# Patient Record
Sex: Male | Born: 1980 | Hispanic: No | Marital: Married | Smoking: Former smoker
Health system: Southern US, Community
[De-identification: ages and names within clinical notes are randomized; demographics above are authoritative.]

## PROBLEM LIST (undated history)

## (undated) DIAGNOSIS — R12 Heartburn: Secondary | ICD-10-CM

## (undated) DIAGNOSIS — Q249 Congenital malformation of heart, unspecified: Secondary | ICD-10-CM

## (undated) HISTORY — DX: Congenital malformation of heart, unspecified: Q24.9

## (undated) HISTORY — DX: Heartburn: R12

---

## 2021-01-22 DIAGNOSIS — Z0289 Encounter for other administrative examinations: Secondary | ICD-10-CM | POA: Insufficient documentation

## 2021-01-23 ENCOUNTER — Other Ambulatory Visit: Payer: Self-pay

## 2021-01-23 ENCOUNTER — Ambulatory Visit (INDEPENDENT_AMBULATORY_CARE_PROVIDER_SITE_OTHER): Payer: Self-pay | Admitting: Family Medicine

## 2021-01-23 ENCOUNTER — Other Ambulatory Visit (HOSPITAL_COMMUNITY)
Admission: RE | Admit: 2021-01-23 | Discharge: 2021-01-23 | Disposition: A | Discharge status: Home / Self Care | Payer: Medicaid Other | Source: Ambulatory Visit | Attending: Family Medicine | Admitting: Family Medicine

## 2021-01-23 VITALS — BP 110/84 | HR 54 | Ht 70.28 in | Wt 185.2 lb

## 2021-01-23 DIAGNOSIS — Z1159 Encounter for screening for other viral diseases: Secondary | ICD-10-CM

## 2021-01-23 DIAGNOSIS — R12 Heartburn: Secondary | ICD-10-CM

## 2021-01-23 DIAGNOSIS — Z114 Encounter for screening for human immunodeficiency virus [HIV]: Secondary | ICD-10-CM

## 2021-01-23 DIAGNOSIS — Z113 Encounter for screening for infections with a predominantly sexual mode of transmission: Secondary | ICD-10-CM

## 2021-01-23 DIAGNOSIS — Z0289 Encounter for other administrative examinations: Secondary | ICD-10-CM | POA: Insufficient documentation

## 2021-01-23 DIAGNOSIS — K5900 Constipation, unspecified: Secondary | ICD-10-CM | POA: Insufficient documentation

## 2021-01-23 DIAGNOSIS — Q254 Congenital malformation of aorta unspecified: Secondary | ICD-10-CM

## 2021-01-23 DIAGNOSIS — L738 Other specified follicular disorders: Secondary | ICD-10-CM

## 2021-01-23 LAB — POCT URINALYSIS DIP (MANUAL ENTRY)
Bilirubin, UA: NEGATIVE
Blood, UA: NEGATIVE
Glucose, UA: NEGATIVE mg/dL
Ketones, POC UA: NEGATIVE mg/dL
Leukocytes, UA: NEGATIVE
Nitrite, UA: NEGATIVE
Protein Ur, POC: NEGATIVE mg/dL
Spec Grav, UA: 1.025 (ref 1.010–1.025)
Urobilinogen, UA: 0.2 E.U./dL
pH, UA: 5.5 (ref 5.0–8.0)

## 2021-01-23 MED ORDER — POLYETHYLENE GLYCOL 3350 17 GM/SCOOP PO POWD: 17.0000 g | Freq: Every day | ORAL | 1 refills | Status: AC

## 2021-01-23 MED ORDER — FAMOTIDINE 20 MG PO TABS: 20.0000 mg | ORAL_TABLET | Freq: Two times a day (BID) | ORAL | 2 refills | Status: DC

## 2021-01-23 MED ORDER — IVERMECTIN 3 MG PO TABS: 200.0000 ug/kg | ORAL_TABLET | Freq: Every day | ORAL | 0 refills | Status: AC

## 2021-01-23 MED ORDER — TRIAMCINOLONE ACETONIDE 0.5 % EX OINT: 1.0000 "application " | TOPICAL_OINTMENT | Freq: Two times a day (BID) | CUTANEOUS | 3 refills | Status: DC

## 2021-01-23 MED ORDER — PRAZIQUANTEL 600 MG PO TABS: 3300.0000 mg | ORAL_TABLET | Freq: Once | ORAL | 0 refills | Status: AC

## 2021-01-23 MED ORDER — TRIAMCINOLONE ACETONIDE 0.5 % EX OINT
1.0000 | TOPICAL_OINTMENT | Freq: Two times a day (BID) | CUTANEOUS | 3 refills | Status: AC
Start: 2021-01-23 — End: ?

## 2021-01-23 NOTE — Assessment & Plan Note (Signed)
New finding on pre-departure exam. Asymptomatic. Overall normal cardiopulmonary exam today without findings of clubbing.  - Echo scheduled - scheduled for follow up with cardiology

## 2021-01-23 NOTE — Assessment & Plan Note (Signed)
Epigastric pain with certain foods. Currently on ranitidine from [place]. Will transition him to Pepcid 20mg  BID Plan to obtain H. Pylori testing at follow up visit

## 2021-01-23 NOTE — Assessment & Plan Note (Addendum)
Chronic. Unclear etiology. Appears somewhat consistent with folliculitis or pseudofolliculitis from razor. No signs of acute infection. Will treat with triamcinolone 0.5% ointment for now with plan for follow up. Consider biopsy for further evaluation.

## 2021-01-23 NOTE — Patient Instructions (Addendum)
It was wonderful to see you today.  Please bring ALL of your medications with you to every visit.   Today we talked about:  - Welcome to our clinic! - We will get you scheduled for more pictures of your heart as well as to see a specialist to talk about this - We have scheduled you on 02/07/2021 for a follow up with Dr Mauri Reading   Thank you for choosing [facility].   Please call [phone] with any questions about today's appointment.  Please be sure to schedule follow up at the front  desk before you leave today.   Burley Saver, MD  Family Medicine
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
## 2021-01-23 NOTE — Assessment & Plan Note (Signed)
Endorses hard but regular stools. Start Miralax QD. Can increase to BID dosing if needed.

## 2021-01-23 NOTE — Assessment & Plan Note (Signed)
Refugee labs obtained Ivermectin and Praziquantel provided

## 2021-01-24 LAB — URINE CYTOLOGY ANCILLARY ONLY
Chlamydia: NEGATIVE
Comment: NEGATIVE
Comment: NORMAL
Neisseria Gonorrhea: NEGATIVE

## 2021-01-26 LAB — CBC WITH DIFFERENTIAL/PLATELET
Basophils Absolute: 0 10*3/uL (ref 0.0–0.2)
Basos: 1 %
EOS (ABSOLUTE): 0.1 10*3/uL (ref 0.0–0.4)
Eos: 3 %
Hematocrit: 41.7 % (ref 37.5–51.0)
Hemoglobin: 14.5 g/dL (ref 13.0–17.7)
Immature Grans (Abs): 0 10*3/uL (ref 0.0–0.1)
Immature Granulocytes: 0 %
Lymphocytes Absolute: 1.5 10*3/uL (ref 0.7–3.1)
Lymphs: 37 %
MCH: 30.5 pg (ref 26.6–33.0)
MCHC: 34.8 g/dL (ref 31.5–35.7)
MCV: 88 fL (ref 79–97)
Monocytes Absolute: 0.4 10*3/uL (ref 0.1–0.9)
Monocytes: 10 %
Neutrophils Absolute: 2 10*3/uL (ref 1.4–7.0)
Neutrophils: 49 %
Platelets: 215 10*3/uL (ref 150–450)
RBC: 4.75 x10E6/uL (ref 4.14–5.80)
RDW: 12.7 % (ref 11.6–15.4)
WBC: 4.1 10*3/uL (ref 3.4–10.8)

## 2021-01-26 LAB — RPR: RPR Ser Ql: NONREACTIVE

## 2021-01-26 LAB — HEPATITIS B SURFACE ANTIBODY, QUANTITATIVE: Hepatitis B Surf Ab Quant: <3.1 m[IU]/mL — ABNORMAL LOW (ref >9.9)

## 2021-01-26 LAB — COMPREHENSIVE METABOLIC PANEL
ALT: 21 IU/L (ref 0–44)
AST: 15 IU/L (ref 0–40)
Albumin/Globulin Ratio: 1.5 (ref 1.2–2.2)
Albumin: 4.8 g/dL (ref 4.0–5.0)
Alkaline Phosphatase: 86 IU/L (ref 44–121)
BUN/Creatinine Ratio: 10 (ref 9–20)
BUN: 8 mg/dL (ref 6–20)
Bilirubin Total: 0.3 mg/dL (ref 0.0–1.2)
CO2: 22 mmol/L (ref 20–29)
Calcium: 9.8 mg/dL (ref 8.7–10.2)
Chloride: 100 mmol/L (ref 96–106)
Creatinine, Ser: 0.81 mg/dL (ref 0.76–1.27)
Globulin, Total: 3.2 g/dL (ref 1.5–4.5)
Glucose: 86 mg/dL (ref 65–99)
Potassium: 4.2 mmol/L (ref 3.5–5.2)
Sodium: 141 mmol/L (ref 134–144)
Total Protein: 8 g/dL (ref 6.0–8.5)
eGFR: 116 mL/min/{1.73_m2} (ref >59)

## 2021-01-26 LAB — HEPATITIS B CORE ANTIBODY, TOTAL: Hep B Core Total Ab: NEGATIVE

## 2021-01-26 LAB — LIPID PANEL
Chol/HDL Ratio: 5.7 ratio — ABNORMAL HIGH (ref 0.0–5.0)
Cholesterol, Total: 227 mg/dL — ABNORMAL HIGH (ref 100–199)
HDL: 40 mg/dL (ref >39)
LDL Chol Calc (NIH): 168 mg/dL — ABNORMAL HIGH (ref 0–99)
Triglycerides: 107 mg/dL (ref 0–149)
VLDL Cholesterol Cal: 19 mg/dL (ref 5–40)

## 2021-01-26 LAB — TSH: TSH: 2.32 u[IU]/mL (ref 0.450–4.500)

## 2021-01-26 LAB — HIV ANTIBODY (ROUTINE TESTING W REFLEX): HIV Screen 4th Generation wRfx: NONREACTIVE

## 2021-01-26 LAB — HCV INTERPRETATION

## 2021-01-26 LAB — VARICELLA ZOSTER ANTIBODY, IGG: Varicella zoster IgG: 2032 index (ref >165)

## 2021-01-26 LAB — HCV AB W REFLEX TO QUANT PCR: HCV Ab: <0.1 s/co ratio (ref 0.0–0.9)

## 2021-01-26 LAB — HEPATITIS B SURFACE ANTIGEN: Hepatitis B Surface Ag: NEGATIVE

## 2021-02-02 ENCOUNTER — Other Ambulatory Visit: Payer: Self-pay

## 2021-02-02 ENCOUNTER — Ambulatory Visit (HOSPITAL_COMMUNITY)
Admission: RE | Admit: 2021-02-02 | Discharge: 2021-02-02 | Disposition: A | Discharge status: Home / Self Care | Payer: Medicaid Other | Source: Ambulatory Visit | Attending: Family Medicine | Admitting: Family Medicine

## 2021-02-02 DIAGNOSIS — Q254 Congenital malformation of aorta unspecified: Secondary | ICD-10-CM | POA: Diagnosis not present

## 2021-02-02 DIAGNOSIS — I351 Nonrheumatic aortic (valve) insufficiency: Secondary | ICD-10-CM | POA: Diagnosis not present

## 2021-02-02 LAB — ECHOCARDIOGRAM COMPLETE
AV Vena cont: 0.5 cm
Area-P 1/2: 2.48 cm2
Calc EF: 46.8 %
P 1/2 time: 1060 msec
S' Lateral: 3.8 cm
Single Plane A2C EF: 39.9 %
Single Plane A4C EF: 52.9 %

## 2021-02-04 NOTE — Progress Notes (Deleted)
   Subjective:   Patient ID: Joseph Mueller    DOB: [birth year], 40 y.o. male   MRN: [id number]  Joseph Mueller is a 40 y.o. male with a history of congenital anomalies of aortic arch, pseudofolliculitis, constipation, heart burn here for follow up  Current Concerns: Back Pain**  Heart Burn: Started on Pepcid.  Today he notes***  Lab review: overall labs normal. Hepatitis B nonimmune.   Review of Systems:  Per HPI.   Objective:   There were no vitals taken for this visit. Vitals and nursing note reviewed.  General: pleasant ***, sitting comfortably in exam chair, well nourished, well developed, in no acute distress with non-toxic appearance HEENT: normocephalic, atraumatic, moist mucous membranes, oropharynx clear without erythema or exudate, TM normal bilaterally  Neck: supple, non-tender without lymphadenopathy CV: regular rate and rhythm without murmurs, rubs, or gallops, no lower extremity edema, 2+ radial and pedal pulses bilaterally Lungs: clear to auscultation bilaterally with normal work of breathing on room air Resp: breathing comfortably on room air, speaking in full sentences Abdomen: soft, non-tender, non-distended, no masses or organomegaly palpable, normoactive bowel sounds Skin: warm, dry, no rashes or lesions Extremities: warm and well perfused, normal tone MSK: ROM grossly intact, strength intact, gait normal Neuro: Alert and oriented, speech normal  Assessment & Plan:   No problem-specific Assessment & Plan notes found for this encounter.  No orders of the defined types were placed in this encounter.  No orders of the defined types were placed in this encounter.  Cardiac Anomalies: Noted on prior imaging. Follow up echo noted EF 45-50%, global LV hypokinesis, mild RV enlargement. His prior reported right sided aortic arch with aberrant left subclavian artery associated with a Komerell's diverticulum was not well visualized on current  study with recommendation of CTA or MRA for further evaluation. 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 He has appointment with cardiology on 02/09/21.   Hepatitis B Nonimmune: Need for Hep B vaccination   Hyperlipidemia: Elevated Lipid panel. Discussed and recommended lifestyle modifications. Recommended continued monitoring  Heart burn; Started on Pepcid. H. Pylori testing today  Pseudofolliculitis:  Continue Triamcinolone Can consider topical clindamycin and topical retinoid   {    This will disappear when note is signed, click to select method of visit    :1}  Orpah Cobb, DO PGY-3, [facility] 02/04/2021 1:06 PM

## 2021-02-07 ENCOUNTER — Ambulatory Visit: Payer: Self-pay | Admitting: Family Medicine

## 2021-02-08 NOTE — Progress Notes (Signed)
Cardiology Office Note   Date:  02/09/2021   ID:  Joseph Mueller, DOB 11/20/1980, MRN [id number]  PCP:  Joana Reamer, DO  Cardiologist:   None Referring:  Joana Reamer, DO  No chief complaint on file.     History of Present Illness: Joseph Mueller is a 40 y.o. male who is referred by Joana Reamer, DO for evaluation of an abnormal echo.  He was found to have an ejection fraction of 45 to 50% with global hypokinesis.  He had mildly enlarged right ventricle.  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  An illustration of how this variant might appear the as below.  The patient is from [place].  He is here now for the last month with his family.  He reports having an MRI in [place] although I do not have these results.  I believe it was an MRI and not a CT.  I see a description of this from his primary care notes which she has a right-sided aortic arch with aberrant left subclavian artery with proximal ectatic area.  Subclavian artery passes posterior to the trachea and the esophagus.  There is a double superior vena cava with the presence of a left brachiocephalic vein.  This was follow-up to a chest x-ray which was said to be abnormal.  He had some vague chest discomfort at 1 point.  He has some GI problems with some reflux.  He thinks his pain is associated with that.  He has had some occasional shortness of breath with activity such as playing football.  However, he thinks he is active without bringing on symptoms usually.  He does not describe substernal chest pressure, neck or arm discomfort.  He does not have any palpitations, presyncope or syncope.  He denies any PND or orthopnea.  He is otherwise not had any prior cardiac history or work-up.  The history was obtained through medical interpreter.  He speaks Arabic.       Past Medical History:   Diagnosis Date  . Congenital heart anomaly   . Heart burn     PSH:  None   Current Outpatient Medications  Medication Sig Dispense Refill  . famotidine (PEPCID) 20 MG tablet Take 1 tablet (20 mg total) by mouth 2 (two) times daily. 60 tablet 2  . polyethylene glycol powder (GLYCOLAX/MIRALAX) 17 GM/SCOOP powder Take 17 g by mouth daily. 3350 g 1  . triamcinolone ointment (KENALOG) 0.5 % Apply 1 application topically 2 (two) times daily. Until improved. 60 g 3   No current facility-administered medications for this visit.    Allergies:   Patient has no known allergies.    Social History:  The patient  reports that he has been smoking cigarettes. He has smoked for the past 5.00 years. He has never used smokeless tobacco. He reports that he does not drink alcohol and does not use drugs.   He has 4 children.  Family History:  The patient's  family history is not on file.   's parents were killed in this conflict and [place]   ROS:  Please see the history of present illness.   Otherwise, review of systems are positive for none.   All other systems are reviewed and negative.    PHYSICAL EXAM: VS:  BP 108/88   Pulse (!) 50   Ht 5\' 10"  (1.778 m)  Wt 189 lb (85.7 kg)   BMI 27.12 kg/m  , BMI Body mass index is 27.12 kg/m.-Blood pressures are equal in both arms GENERAL:  Well appearing HEENT:  Pupils equal round and reactive, fundi not visualized, oral mucosa unremarkable NECK:  No jugular venous distention, waveform within normal limits, carotid upstroke brisk and symmetric, no bruits, no thyromegaly LYMPHATICS:  No cervical, inguinal adenopathy LUNGS:  Clear to auscultation bilaterally BACK:  No CVA tenderness CHEST:  Unremarkable HEART:  PMI not displaced or sustained,S1 and S2 within normal limits, no S3, no S4, no clicks, no rubs, no murmurs ABD:  Flat, positive bowel sounds normal in frequency in pitch, no bruits, no rebound, no guarding, no midline pulsatile mass, no  hepatomegaly, no splenomegaly EXT:  2 plus pulses throughout, no edema, no cyanosis no clubbing SKIN:  No rashes no nodules NEURO:  Cranial nerves II through XII grossly intact, motor grossly intact throughout PSYCH:  Cognitively intact, oriented to person place and time    EKG:  EKG is ordered today. The ekg ordered today demonstrates sinus rhythm, rate 50, axis within normal limits, intervals within normal limits, no acute ST-T wave changes.   Recent Labs: 01/23/2021: ALT 21; BUN 8; Creatinine, Ser 0.81; Hemoglobin 14.5; Platelets 215; Potassium 4.2; Sodium 141; TSH 2.320    Lipid Panel    Component Value Date/Time   CHOL 227 (H) 01/23/2021 1435   TRIG 107 01/23/2021 1435   HDL 40 01/23/2021 1435   CHOLHDL 5.7 (H) 01/23/2021 1435   LDLCALC 168 (H) 01/23/2021 1435      Wt Readings from Last 3 Encounters:  02/09/21 189 lb (85.7 kg)  01/23/21 185 lb 3.2 oz (84 kg)      Other studies Reviewed: Additional studies/ records that were reviewed today include: Office note and echo reviewed. Review of the above records demonstrates:  Please see elsewhere in the note.     ASSESSMENT AND PLAN:  ABNORMAL AORTIC ARCH:    I reviewed his echocardiogram.  I do not yet have any old records but I have contacted his primary provider to see if they have any that I can access.  I think the best imaging would be an MRI/MRA as a baseline.  However, I doubt that we will have to do anything since he is not having any symptoms but further evaluation and suggestions will be based on the anatomy.  CARDIOMYOPATHY: I will assess his ejection fraction as above.  Might start a low-dose of afterload reduction but he has class I symptoms at this point.     Current medicines are reviewed at length with the patient today.  The patient does not have concerns regarding medicines.  The following changes have been made:  no change  Labs/ tests ordered today include:   Orders Placed This Encounter   Procedures  . MR Card Morphology Wo/W Cm  . MR ANGIO CHEST W WO CONTRAST  . CBC w/Diff/Platelet  . Basic metabolic panel  . EKG 12-Lead     Disposition:   FU with after the imaging   Signed, Rollene Rotunda, MD  02/09/2021 5:56 PM    [facility]

## 2021-02-09 ENCOUNTER — Ambulatory Visit (INDEPENDENT_AMBULATORY_CARE_PROVIDER_SITE_OTHER): Payer: Medicaid Other | Admitting: Cardiology

## 2021-02-09 ENCOUNTER — Encounter: Payer: Self-pay | Admitting: Cardiology

## 2021-02-09 ENCOUNTER — Other Ambulatory Visit: Payer: Self-pay

## 2021-02-09 VITALS — BP 108/88 | HR 50 | Ht 70.0 in | Wt 189.0 lb

## 2021-02-09 DIAGNOSIS — R931 Abnormal findings on diagnostic imaging of heart and coronary circulation: Secondary | ICD-10-CM

## 2021-02-09 DIAGNOSIS — I420 Dilated cardiomyopathy: Secondary | ICD-10-CM | POA: Diagnosis not present

## 2021-02-09 DIAGNOSIS — Q2549 Other congenital malformations of aorta: Secondary | ICD-10-CM | POA: Diagnosis not present

## 2021-02-09 NOTE — Patient Instructions (Addendum)
Medication Instructions:  Continue same medications *If you need a refill on your cardiac medications before your next appointment, please call your pharmacy*   Lab Work: Cbc,bmet    Testing/Procedures: Cardiac MRI Cardiac MRA   Follow-Up: At [facility], you and your health needs are our priority.  As part of our continuing mission to provide you with exceptional heart care, we have created designated Provider Care Teams.  These Care Teams include your primary Cardiologist (physician) and Advanced Practice Providers (APPs -  Physician Assistants and Nurse Practitioners) who all work together to provide you with the care you need, when you need it.  We recommend signing up for the patient portal called "MyChart".  Sign up information is provided on this After Visit Summary.  MyChart is used to connect with patients for Virtual Visits (Telemedicine).  Patients are able to view lab/test results, encounter notes, upcoming appointments, etc.  Non-urgent messages can be sent to your provider as well.   To learn more about what you can do with MyChart, go to [email or web address].    Your next appointment: After test  The format for your next appointment: Office    Provider:  Dr.Hochrein

## 2021-02-12 ENCOUNTER — Telehealth: Payer: Self-pay | Admitting: Cardiology

## 2021-02-12 NOTE — Telephone Encounter (Signed)
Called to discuss preferred scheduling weekdays and times for the Cardiac MRI/MRA ordered by Dr. Juliann Mule does not speak English--.

## 2021-02-13 LAB — BASIC METABOLIC PANEL
BUN/Creatinine Ratio: 13 (ref 9–20)
BUN: 9 mg/dL (ref 6–20)
CO2: 27 mmol/L (ref 20–29)
Calcium: 9.7 mg/dL (ref 8.7–10.2)
Chloride: 102 mmol/L (ref 96–106)
Creatinine, Ser: 0.71 mg/dL — ABNORMAL LOW (ref 0.76–1.27)
Glucose: 111 mg/dL — ABNORMAL HIGH (ref 65–99)
Potassium: 4.3 mmol/L (ref 3.5–5.2)
Sodium: 144 mmol/L (ref 134–144)
eGFR: 120 mL/min/{1.73_m2} (ref >59)

## 2021-02-13 LAB — CBC WITH DIFFERENTIAL/PLATELET
Basophils Absolute: 0.1 10*3/uL (ref 0.0–0.2)
Basos: 1 %
EOS (ABSOLUTE): 3.3 10*3/uL — ABNORMAL HIGH (ref 0.0–0.4)
Eos: 40 %
Hematocrit: 44.8 % (ref 37.5–51.0)
Hemoglobin: 15 g/dL (ref 13.0–17.7)
Immature Grans (Abs): 0 10*3/uL (ref 0.0–0.1)
Immature Granulocytes: 0 %
Lymphocytes Absolute: 1.9 10*3/uL (ref 0.7–3.1)
Lymphs: 23 %
MCH: 29.6 pg (ref 26.6–33.0)
MCHC: 33.5 g/dL (ref 31.5–35.7)
MCV: 88 fL (ref 79–97)
Monocytes Absolute: 0.4 10*3/uL (ref 0.1–0.9)
Monocytes: 4 %
Neutrophils Absolute: 2.7 10*3/uL (ref 1.4–7.0)
Neutrophils: 32 %
Platelets: 172 10*3/uL (ref 150–450)
RBC: 5.07 x10E6/uL (ref 4.14–5.80)
RDW: 13.1 % (ref 11.6–15.4)
WBC: 8.4 10*3/uL (ref 3.4–10.8)

## 2021-02-15 ENCOUNTER — Encounter: Payer: Self-pay | Admitting: *Deleted

## 2021-02-16 ENCOUNTER — Encounter: Payer: Self-pay | Admitting: Cardiology

## 2021-02-16 NOTE — Telephone Encounter (Signed)
Unable to reach patient by phone to discuss the Monday 03/26/21 8:00 am Cardiac MRI and MRA chest appointment at [facility]----arrival time is 7:30 am--1st floor admissions office for check in.  Will mail information to patient with notation to call with questions or concerns.

## 2021-02-21 ENCOUNTER — Emergency Department (HOSPITAL_COMMUNITY)
Admission: EM | Admit: 2021-02-21 | Discharge: 2021-02-21 | Disposition: A | Discharge status: Home / Self Care | Payer: Medicaid Other | Attending: Emergency Medicine | Admitting: Emergency Medicine

## 2021-02-21 ENCOUNTER — Other Ambulatory Visit: Payer: Self-pay

## 2021-02-21 ENCOUNTER — Encounter (HOSPITAL_COMMUNITY): Payer: Self-pay | Admitting: Emergency Medicine

## 2021-02-21 ENCOUNTER — Emergency Department (HOSPITAL_COMMUNITY): Payer: Medicaid Other

## 2021-02-21 DIAGNOSIS — R1013 Epigastric pain: Secondary | ICD-10-CM | POA: Insufficient documentation

## 2021-02-21 DIAGNOSIS — R1084 Generalized abdominal pain: Secondary | ICD-10-CM | POA: Diagnosis not present

## 2021-02-21 DIAGNOSIS — L509 Urticaria, unspecified: Secondary | ICD-10-CM | POA: Diagnosis not present

## 2021-02-21 DIAGNOSIS — R918 Other nonspecific abnormal finding of lung field: Secondary | ICD-10-CM | POA: Diagnosis not present

## 2021-02-21 DIAGNOSIS — R9389 Abnormal findings on diagnostic imaging of other specified body structures: Secondary | ICD-10-CM

## 2021-02-21 DIAGNOSIS — F17210 Nicotine dependence, cigarettes, uncomplicated: Secondary | ICD-10-CM | POA: Insufficient documentation

## 2021-02-21 LAB — COMPREHENSIVE METABOLIC PANEL
ALT: 39 U/L (ref 0–44)
AST: 26 U/L (ref 15–41)
Albumin: 3.7 g/dL (ref 3.5–5.0)
Alkaline Phosphatase: 79 U/L (ref 38–126)
Anion gap: 7 (ref 5–15)
BUN: 11 mg/dL (ref 6–20)
CO2: 26 mmol/L (ref 22–32)
Calcium: 9.1 mg/dL (ref 8.9–10.3)
Chloride: 106 mmol/L (ref 98–111)
Creatinine, Ser: 0.79 mg/dL (ref 0.61–1.24)
GFR, Estimated: >60 mL/min (ref >60)
Glucose, Bld: 127 mg/dL — ABNORMAL HIGH (ref 70–99)
Potassium: 3.7 mmol/L (ref 3.5–5.1)
Sodium: 139 mmol/L (ref 135–145)
Total Bilirubin: 0.7 mg/dL (ref 0.3–1.2)
Total Protein: 7.1 g/dL (ref 6.5–8.1)

## 2021-02-21 LAB — CBC WITH DIFFERENTIAL/PLATELET
Abs Immature Granulocytes: 0.01 10*3/uL (ref 0.00–0.07)
Basophils Absolute: 0 10*3/uL (ref 0.0–0.1)
Basophils Relative: 1 %
Eosinophils Absolute: 1 10*3/uL — ABNORMAL HIGH (ref 0.0–0.5)
Eosinophils Relative: 20 %
HCT: 41 % (ref 39.0–52.0)
Hemoglobin: 13.8 g/dL (ref 13.0–17.0)
Immature Granulocytes: 0 %
Lymphocytes Relative: 36 %
Lymphs Abs: 1.8 10*3/uL (ref 0.7–4.0)
MCH: 29.7 pg (ref 26.0–34.0)
MCHC: 33.7 g/dL (ref 30.0–36.0)
MCV: 88.2 fL (ref 80.0–100.0)
Monocytes Absolute: 0.6 10*3/uL (ref 0.1–1.0)
Monocytes Relative: 12 %
Neutro Abs: 1.5 10*3/uL — ABNORMAL LOW (ref 1.7–7.7)
Neutrophils Relative %: 31 %
Platelets: 172 10*3/uL (ref 150–400)
RBC: 4.65 MIL/uL (ref 4.22–5.81)
RDW: 12.7 % (ref 11.5–15.5)
WBC: 4.8 10*3/uL (ref 4.0–10.5)
nRBC: 0 % (ref 0.0–0.2)

## 2021-02-21 LAB — URINALYSIS, ROUTINE W REFLEX MICROSCOPIC
Bilirubin Urine: NEGATIVE
Glucose, UA: NEGATIVE mg/dL
Hgb urine dipstick: NEGATIVE
Ketones, ur: NEGATIVE mg/dL
Leukocytes,Ua: NEGATIVE
Nitrite: NEGATIVE
Protein, ur: NEGATIVE mg/dL
Specific Gravity, Urine: 1.002 — ABNORMAL LOW (ref 1.005–1.030)
pH: 7 (ref 5.0–8.0)

## 2021-02-21 LAB — LIPASE, BLOOD: Lipase: 33 U/L (ref 11–51)

## 2021-02-21 MED ORDER — KETOROLAC TROMETHAMINE 15 MG/ML IJ SOLN
30.0000 mg | Freq: Once | INTRAMUSCULAR | Status: AC
  Administered 2021-02-21: 30 mg via INTRAMUSCULAR
  Filled 2021-02-21: qty 2

## 2021-02-21 MED ORDER — DIPHENHYDRAMINE HCL 25 MG PO CAPS
25.0000 mg | ORAL_CAPSULE | Freq: Once | ORAL | Status: AC
  Administered 2021-02-21: 25 mg via ORAL
  Filled 2021-02-21: qty 1

## 2021-02-21 MED ORDER — FAMOTIDINE 20 MG PO TABS
20.0000 mg | ORAL_TABLET | Freq: Once | ORAL | Status: AC
  Administered 2021-02-21: 20 mg via ORAL
  Filled 2021-02-21: qty 1

## 2021-02-21 NOTE — ED Provider Notes (Signed)
[facility] Provider Note   CSN: [id number] Arrival date & time: 02/21/21  0433     History Chief Complaint  Patient presents with  . Abdominal Pain    [facility] is a 40 y.o. male.  HPI  Patient is a 39 year old male speaks Arabic.  Arabic translator was used for entirety of patient visit.  Patient states that he has had some abdominal discomfort since yesterday.  He points that his epigastrium to indicate where it is hurting.  He states it feels somewhat swollen but states that his abdomen is not more distended or protuberant than usual.  He describes the pain is burning and achy.  He denies any aggravating or alleviating factors.  He states his bowel movements have been brown soft with no blood.  No melena hematochezia.  Denies any nausea vomiting diarrhea fevers chest pain or shortness of breath.    He states that he has also had itchy rash to his abdomen for the past 24 hours as well.  Denies any shortness of breath lightheadedness or dizziness no tongue swelling or difficulty swallowing.     Past Medical History:  Diagnosis Date  . Congenital heart anomaly   . Heart burn     Patient Active Problem List   Diagnosis Date Noted  . 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 Heart burn 01/23/2021  . Constipation 01/23/2021  . 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 Encounter for health examination of refugee 01/22/2021    History reviewed. No pertinent surgical history.     No family history on file.  Social History   Tobacco Use  . Smoking status: Current Every Day Smoker    Years: 5.00    Types: Cigarettes  . Smokeless tobacco: Never Used  . Tobacco comment: Quit 3 years ago  Vaping Use  . Vaping Use: Never used  Substance Use Topics  . Alcohol use: Never  . Drug use: Never    Home Medications Prior to Admission medications   Medication Sig Start Date End Date Taking? Authorizing Provider   famotidine (PEPCID) 20 MG tablet Take 1 tablet (20 mg total) by mouth 2 (two) times daily. 01/23/21   Mullis, Kiersten P, DO  polyethylene glycol powder (GLYCOLAX/MIRALAX) 17 GM/SCOOP powder Take 17 g by mouth daily. 01/23/21   Mullis, Kiersten P, DO  triamcinolone ointment (KENALOG) 0.5 % Apply 1 application topically 2 (two) times daily. Until improved. 01/23/21   Mullis, Kiersten P, DO    Allergies    Patient has no known allergies.  Review of Systems   Review of Systems  Constitutional: Negative for chills and fever.  HENT: Negative for congestion.   Eyes: Negative for pain.  Respiratory: Negative for cough and shortness of breath.   Cardiovascular: Negative for chest pain and leg swelling.  Gastrointestinal: Positive for abdominal pain. Negative for diarrhea, nausea and vomiting.  Genitourinary: Negative for dysuria.  Musculoskeletal: Negative for myalgias.  Skin: Negative for rash.  Neurological: Negative for dizziness and headaches.    Physical Exam Updated Vital Signs BP (!) 130/102   Pulse (!) 49   Temp (!) 97.4 F (36.3 C) (Oral)   Resp 15   SpO2 100%   Physical Exam Vitals and nursing note reviewed.  Constitutional:      General: He is not in acute distress. HENT:     Head: Normocephalic and atraumatic.     Nose: Nose normal.  Eyes:     General: No scleral  icterus. Cardiovascular:     Rate and Rhythm: Normal rate and regular rhythm.     Pulses: Normal pulses.     Heart sounds: Normal heart sounds.  Pulmonary:     Effort: Pulmonary effort is normal. No respiratory distress.     Breath sounds: No wheezing.  Abdominal:     Palpations: Abdomen is soft.     Tenderness: There is abdominal tenderness.     Comments: Abdomen soft, flat, normal bowel sounds.  Very mild epigastric tenderness to palpation.  No guarding or rebound.  Negative Murphy sign, negative McBurney.  No lower abdominal tenderness.  Negative CVA tenderness.  Musculoskeletal:     Cervical back:  Normal range of motion.     Right lower leg: No edema.     Left lower leg: No edema.  Skin:    General: Skin is warm and dry.     Capillary Refill: Capillary refill takes less than 2 seconds.     Comments: Urticarial appearing rash to the abdomen  Neurological:     Mental Status: He is alert. Mental status is at baseline.  Psychiatric:        Mood and Affect: Mood normal.        Behavior: Behavior normal.     ED Results / Procedures / Treatments   Labs (all labs ordered are listed, but only abnormal results are displayed) Labs Reviewed  COMPREHENSIVE METABOLIC PANEL - Abnormal; Notable for the following components:      Result Value   Glucose, Bld 127 (*)    All other components within normal limits  CBC WITH DIFFERENTIAL/PLATELET - Abnormal; Notable for the following components:   Neutro Abs 1.5 (*)    Eosinophils Absolute 1.0 (*)    All other components within normal limits  URINALYSIS, ROUTINE W REFLEX MICROSCOPIC - Abnormal; Notable for the following components:   Color, Urine COLORLESS (*)    Specific Gravity, Urine 1.002 (*)    All other components within normal limits  LIPASE, BLOOD    EKG None  Radiology DG Abdomen Acute W/Chest  Result Date: 02/21/2021 CLINICAL DATA:  Abdominal distension EXAM: DG ABDOMEN ACUTE WITH 1 VIEW CHEST COMPARISON:  None. FINDINGS: Lungs are clear. No pneumothorax or pleural effusion. Mediastinal contour suggests a possible double aortic arch versus marked tortuosity of the thoracic aorta. Cardiac size within normal limits. Pulmonary vascularity is normal. Normal abdominal gas pattern. No gross free intraperitoneal gas. No organomegaly. No abnormal calcifications within the abdomen and pelvis. No acute bone abnormality. IMPRESSION: Normal abdominal gas pattern. No radiographic evidence of acute cardiopulmonary disease. 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 This could be better assessed with CT arteriography, if  indicated. Electronically Signed   By: Helyn Numbers MD   On: 02/21/2021 05:37    Procedures Procedures   Medications Ordered in ED Medications  diphenhydrAMINE (BENADRYL) capsule 25 mg (25 mg Oral Given 02/21/21 0901)  famotidine (PEPCID) tablet 20 mg (20 mg Oral Given 02/21/21 0901)  ketorolac (TORADOL) 15 MG/ML injection 30 mg (30 mg Intramuscular Given 02/21/21 0902)    ED Course  I have reviewed the triage vital signs and the nursing notes.  Pertinent labs & imaging results that were available during my care of the patient were reviewed by me and considered in my medical decision making (see chart for details).    MDM Rules/Calculators/A&P  Patient is a well-appearing 40 year old Arabic speaking male interpreter was used for the entirety of visit.  He has received basic lab work in triage given that he is complaining of some abdominal pain it seems that his primary reason for being here however is because of his hives on his abdomen.  He is overall well-appearing with normal vital signs apart from hypertension.  Provide him with 1 dose of Toradol and 1 dose of Benadryl, and 1 dose of Pepcid.    CBC without leukocytosis or anemia. CMP unremarkable.  Urinalysis unremarkable.  Lipase within normal is no pancreatitis.  Chest x-ray unremarkable does have some evidence of perhaps double aortic arch.  No prior comparison chest x-ray.  Patient is having no chest pain I doubt that this reflects a widened mediastinum from a thoracic artery dissection or other acute emergent condition.  I informed patient of an abnormal chest x-ray finding and informed him he should discuss this with his PCP at follow-up.  Patient feels somewhat improved after Toradol Benadryl and Pepcid.  Discharged with return precautions and follow-up with the [facility].  He understands the need for a follow-up appointment and is agreeable to this plan.  Final Clinical  Impression(s) / ED Diagnoses Final diagnoses:  Generalized abdominal pain  Hives  Abnormal finding on chest xray    Rx / DC Orders ED Discharge Orders    None       Gailen Shelter, [place] 02/21/21 1341    Wynetta Fines, MD 02/22/21 2234

## 2021-02-21 NOTE — ED Provider Notes (Signed)
  Emergency Medicine Provider in Triage Note   MSE was initiated and I personally evaluated the patient  4:56 AM on Feb 21, 2021 as provider in triage.   Chief Complaint: abdominal discomfort.  HPI  Patient is a 40 y.o. who presents to the ED with complaints of abdominal discomfort since yesterday. States it feels swollen/tight with some burning centrally as well, no alleviating/aggravating factors. Has not had a BM today..    Review of Systems  Positive: Abdominal discomfort Negative: Nausea, vomiting, diarrhea, blood in stool  Physical Exam  BP (!) 158/105 (BP Location: Right Arm)   Pulse 66   Temp 99.3 F (37.4 C) (Oral)   Resp 16   SpO2 100%    Gen:   Awake, no distress   HEENT:  Atraumatic  Resp:  Normal effort  Cardiac:  Normal rate  Abd:   No focal tenderness to palpation. Right sided abdomen with somewhat urticarial appearing lesions.  MSK:   Moves extremities without difficulty  Neuro:  Speech clear   Medical Decision Making   Initiation of care has begun. The patient has been counseled on the process, plan, and necessity for staying for the completion/evaluation, informed that the remainder of the evaluation will be completed by another provider, this initial triage assessment does not replace that evaluation, and the importance of remaining in the ED until their evaluation is complete.   Clinical Impression  Abdominal discomfort.   Interpretor utilized throughout triage.         Cherly Anderson, PA-C 02/21/21 0458    Virgina Norfolk, DO 02/21/21 [phone]

## 2021-02-21 NOTE — Discharge Instructions (Addendum)
Your work-up today was reassuringly without abnormality apart from an incidental finding on your chest x-ray which could indicate an uncommon anatomical variation of the large blood vessel in the middle of your chest. This is unrelated to what brought her to the ER today.  Your symptoms are likely going to improve with Tylenol, ibuprofen and Benadryl.  May be helpful to try to narrow down what in your new living environment is causing you to have the skin reaction called hives. Needs Benadryl 25-50 mg every 6 hours as needed for itching.  Please use Tylenol or ibuprofen for pain.  You may use 600 mg ibuprofen every 6 hours or 1000 mg of Tylenol every 6 hours.  You may choose to alternate between the 2.  This would be most effective.  Not to exceed 4 g of Tylenol within 24 hours.  Not to exceed 3200 mg ibuprofen 24 hours.    kan eamaluk alyawm mtmynan dun 'ayi shudhudh bisarf alnazar ean alaiktishaf aleardii ealaa al'ashieat alsiyniat ealaa sadrik waladhi qad yushir 'iilaa tabayun tashrihiin ghayr shayie fi al'aweiat aldamawiat alkabirat fi muntasaf sadrika. hadha la ealaqat lah bima dafaeaha 'iilaa ghurfat altawari alyawma. min almuhtamal 'an tatahasan 'aeraduk mae taylinul wa'iibubrufin wabinadril. qad yakun min almufid muhawalat tadyiq nitaq ma yatasabab fi huduth radi fiel jildiin yusamaa khalaya alnahl fi biyatik almaeishiat aljadidati. yahtaj binadril 25-50 majama kuli 6 saeat hasab alhajat lilhakati. alraja' astikhdam taylinul 'aw aybubrufin lil'almi. [place] astikhdam 600 mijam aybubrufin kula 6 saeat 'aw 1000 mujama min taylinul kula 6 saeati. [place] aikhtiar altabdil bayn 2. wahadha sayakun 'akthar faeaaliatan. la tatajawaz 4 jam min taylinul khilal 24 saeatin. la tatajawaz 3200 majam aybubrufin 24 saeatan.  ??? ???? ????? ??????? ??? ?? ???? ???? ????? ?? ???????? ?????? ??? ?????? ??????? ??? ???? ????? ?? ???? ??? ????? ?????? ??? ???? ?? ??????? ??????? ??????? ?? ????? ????. ???  ?? ????? ?? ??? ????? ??? ???? ??????? ?????.  ?? ??????? ?? ????? ?????? ?? ???????? ??????????? ?????????.  ?? ???? ?? ?????? ?????? ????? ???? ?? ????? ?? ???? ?? ??? ???? ???? ????? ????? ?? ????? ???????? ???????. ????? ???????? 25-50 ??? ?? 6 ????? ??? ?????? ?????.  ?????? ??????? ???????? ?? ?????????? ?????. ????? ??????? 600 ??? ?????????? ?? 6 ????? ?? 1000 ??? ?? ???????? ?? 6 ?????. ????? ?????? ??????? ??? 2. ???? ????? ???? ??????. ?? ?????? 4 ?? ?? ???????? ???? 24 ????. ?? ?????? 3200 ??? ?????????? 24 ????.

## 2021-02-21 NOTE — ED Triage Notes (Signed)
Patient reports abdominal tightness with burning sensation yesterday , denies emesis or diarrhea , no fever or chills .

## 2021-02-22 ENCOUNTER — Ambulatory Visit: Payer: Medicaid Other | Admitting: Family Medicine

## 2021-02-22 ENCOUNTER — Encounter: Payer: Self-pay | Admitting: Family Medicine

## 2021-02-22 VITALS — BP 104/72 | HR 66 | Ht 70.0 in | Wt 191.2 lb

## 2021-02-22 DIAGNOSIS — L738 Other specified follicular disorders: Secondary | ICD-10-CM | POA: Diagnosis not present

## 2021-02-22 DIAGNOSIS — R12 Heartburn: Secondary | ICD-10-CM

## 2021-02-22 NOTE — Patient Instructions (Signed)
It was wonderful to meet you today. Thank you for allowing me to be a part of your care. Below is a short summary of what we discussed at your visit today.      .          .         .  Abdominal Pain   We are testing you for H. Pylori. I will call you with the results. If you are positive, we will treat you with a 10 day course of medicine.   Scalp lesion You have an appointment with the dermatology clinic for biopsy on Thursday May 30 at 1:15pm.               30   1:15 .  Doctor follow up of acid reflux You have a doctor follow up for your acid reflux on June 8 at 3:15pm.              8   3:15 .  Please bring all of your medications to every appointment!         !  If you have any questions or concerns, please do not hesitate to contact us via phone or MyChart message.                   MyChart  Fayette Pho, MD

## 2021-02-22 NOTE — Progress Notes (Signed)
    SUBJECTIVE:   CHIEF COMPLAINT / HPI:   Video Arabic interpreter utilized for the entire clinic visit.   Abdominal Pain follow up - seen at ED 5/18 for epigastric pain - described to Ed provider as feeling swollen, burning, achy - CBC, CMP unremarkable - Lipase normal - UA normal, diluted - also had rash across abdomen - improved with toradol, benadryl, and pepcid - DG ABDOMEN ACUTE WITH 1 VIEW CHEST unremarkable for active disease - no EKG done at ED; previous EKG 02/12/21 demonstrated sinus bradycardia at 50 bpm, QTc 406, isolated peaked T wave in V2 - in previous visit in April, was transitioned from ranitidine to pepcid 20 mg BID - no H. Pylori testing yet performed - stopped taking pepcid two weeks ago because it seemed to make acid reflux worse  Scalp lesion - scalp lesion seen in previous visit on 01/23/21 - prescribed triamcinolone  - improves this somewhat but it still comes back  - patient reports previous provider told him that if lesion persisted that biopsy may be warranted   PERTINENT  PMH / PSH: Refugee status, heartburn, constipation, pseudofolliculitis, congenital anomalies of aortic arch  OBJECTIVE:   BP 104/72   Pulse 66   Ht 5\' 10"  (1.778 m)   Wt 191 lb 3.2 oz (86.7 kg)   SpO2 95%   BMI 27.43 kg/m    PHQ-9:  Depression screen [facility] 2/9 02/22/2021 01/23/2021  Decreased Interest - 0  Down, Depressed, Hopeless 0 2  PHQ - 2 Score 0 2  Altered sleeping 0 0  Tired, decreased energy 0 1  Change in appetite 0 0  Feeling bad or failure about yourself  0 1  Trouble concentrating 1 2  Moving slowly or fidgety/restless 0 0  Suicidal thoughts 0 0  PHQ-9 Score 1 6     GAD-7: No flowsheet data found.   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 Unsure etiology. Questionable infectious process; no warmth, redness, or drainage. Appears almost keloid in nature, however patient denies history of injury, trauma, or repetitive pressure to area.   Heart burn Suspect abdominal and epigastric pain reported in ED yesterday due to acid reflux, as labs unremarkable and symptoms relieved with pepcid (also toradol and benadryl). H. Pylori breath testing performed today as patient last took pepcid two weeks ago. Will call with results. Patient reports pepcid made symptoms worse at home. Never before tested for H. Pylori.      01/25/2021, MD [facility]

## 2021-02-22 NOTE — Assessment & Plan Note (Signed)
Will refer to dermatology clinic for biopsy and tissue sample culture. Unsure etiology. Questionable infectious process; no warmth, redness, or drainage. Appears almost keloid in nature, however patient denies history of injury, trauma, or repetitive pressure to area.

## 2021-02-22 NOTE — Assessment & Plan Note (Addendum)
Suspect abdominal and epigastric pain reported in ED yesterday due to acid reflux, as labs unremarkable and symptoms relieved with pepcid (also toradol and benadryl). H. Pylori breath testing performed today as patient last took pepcid two weeks ago. Will call with results. Patient reports pepcid made symptoms worse at home. Never before tested for H. Pylori.

## 2021-02-24 LAB — H. PYLORI BREATH TEST: H pylori Breath Test: POSITIVE — AB

## 2021-02-26 ENCOUNTER — Other Ambulatory Visit: Payer: Self-pay | Admitting: Family Medicine

## 2021-02-26 DIAGNOSIS — A048 Other specified bacterial intestinal infections: Secondary | ICD-10-CM

## 2021-02-26 MED ORDER — PANTOPRAZOLE SODIUM 40 MG PO TBEC: 40.0000 mg | DELAYED_RELEASE_TABLET | Freq: Two times a day (BID) | ORAL | 0 refills | Status: DC

## 2021-02-26 MED ORDER — PYLERA 140-125-125 MG PO CAPS: 3.0000 | ORAL_CAPSULE | Freq: Three times a day (TID) | ORAL | 0 refills | Status: DC

## 2021-02-26 MED ORDER — PYLERA 140-125-125 MG PO CAPS
3.0000 | ORAL_CAPSULE | Freq: Three times a day (TID) | ORAL | 0 refills | Status: DC
Start: 2021-02-26 — End: 2021-02-26

## 2021-02-26 MED ORDER — PANTOPRAZOLE SODIUM 40 MG PO TBEC
40.0000 mg | DELAYED_RELEASE_TABLET | Freq: Two times a day (BID) | ORAL | 0 refills | Status: DC
Start: 2021-02-26 — End: 2021-03-15

## 2021-02-26 NOTE — Progress Notes (Signed)
Mr. [facility]. Pylori breath test is positive, indicating a current H. Pylori infection as the likely cause for his acid reflux symptoms. Will send scripts to pharmacy for bismuth quadruple therapy x 10 days:  1. Pylera (bismuth subcitrate, metronidazole, and tetracycline) capsule four times daily (before each meal and at bedtime)    2. PPI (pantoprazole 40 mg) twice daily   Above medications are listed as preferred on [place] Medicaid formulary effective 01/05/21. Repeat H. Pylori breath testing 4-6 weeks after completion of bismuth quadruple therapy. Aim for retesting June 24 - July 8.    Fayette Pho, MD

## 2021-02-27 ENCOUNTER — Telehealth: Payer: Self-pay

## 2021-02-27 NOTE — Telephone Encounter (Signed)
Called patient with Arabic interpreter [phone]. Provided with positive H.pylori results with treatment regimen. Scheduled patient for lab visit on 04/02/21 at 3:00 pm. Answered all questions.   Veronda Prude, RN

## 2021-03-02 ENCOUNTER — Telehealth: Payer: Self-pay

## 2021-03-02 NOTE — Telephone Encounter (Signed)
Spoke to patient with Arabic interpreter follow up appointment scheduled with Dr.Hochrein 04/02/21 at 11:20 am.Patient requested all appointments to be mailed.Appointments mailed to patient's home.

## 2021-03-07 ENCOUNTER — Emergency Department (HOSPITAL_COMMUNITY)
Admission: EM | Admit: 2021-03-07 | Discharge: 2021-03-07 | Disposition: A | Discharge status: Home / Self Care | Payer: Medicaid Other | Attending: Emergency Medicine | Admitting: Emergency Medicine

## 2021-03-07 ENCOUNTER — Other Ambulatory Visit: Payer: Self-pay

## 2021-03-07 DIAGNOSIS — F17210 Nicotine dependence, cigarettes, uncomplicated: Secondary | ICD-10-CM | POA: Insufficient documentation

## 2021-03-07 DIAGNOSIS — T7840XA Allergy, unspecified, initial encounter: Secondary | ICD-10-CM | POA: Insufficient documentation

## 2021-03-07 DIAGNOSIS — K130 Diseases of lips: Secondary | ICD-10-CM | POA: Diagnosis not present

## 2021-03-07 DIAGNOSIS — R12 Heartburn: Secondary | ICD-10-CM

## 2021-03-07 MED ORDER — DIPHENHYDRAMINE HCL 25 MG PO TABS: 25.0000 mg | ORAL_TABLET | Freq: Three times a day (TID) | ORAL | 0 refills | Status: DC

## 2021-03-07 MED ORDER — PREDNISONE 20 MG PO TABS: 40.0000 mg | ORAL_TABLET | Freq: Every day | ORAL | 0 refills | Status: DC

## 2021-03-07 MED ORDER — FAMOTIDINE 20 MG PO TABS
20.0000 mg | ORAL_TABLET | Freq: Once | ORAL | Status: AC
  Administered 2021-03-07: 20 mg via ORAL
  Filled 2021-03-07: qty 1

## 2021-03-07 MED ORDER — PREDNISONE 20 MG PO TABS
60.0000 mg | ORAL_TABLET | ORAL | Status: AC
  Administered 2021-03-07: 60 mg via ORAL
  Filled 2021-03-07: qty 3

## 2021-03-07 MED ORDER — DIPHENHYDRAMINE HCL 25 MG PO CAPS
25.0000 mg | ORAL_CAPSULE | Freq: Once | ORAL | Status: AC
  Administered 2021-03-07: 25 mg via ORAL
  Filled 2021-03-07: qty 1

## 2021-03-07 NOTE — Discharge Instructions (Signed)
?? ????? ?? ????? ?? ???? ??????? ??????? ????? ??. ???? ?????? ??????? ??????? ???? ????? ??????? ???.  ?? ?? ??????? ??? ?????????. ?? ????? ?? ?????? ???? ????????? ???? ???? ??? ?????.    It is important that you follow up with your primary care physician.  Please discuss the dermatology referral that should be arranged.  Take all medicine as directed.  Do not hesitate to return for concerning changes in your condition.

## 2021-03-07 NOTE — ED Notes (Signed)
Pt verbalizes understanding of discharge instructions. Opportunity for questions and answers were provided. Pt discharged from the ED.   ?

## 2021-03-07 NOTE — ED Triage Notes (Signed)
Pt reports lower lip swelling since 0500 this morning. Denies throat or mouth swelling. Speaking in complete sentences without difficulty.

## 2021-03-07 NOTE — ED Provider Notes (Signed)
[facility] EMERGENCY DEPARTMENT Provider Note   CSN: [id number] Arrival date & time: 03/07/21  4132     History No chief complaint on file.   Joseph Mueller is a 40 y.o. male.  HPI Patient presents with his wife who assists with the history.  He presents due to lower lip swelling ongoing pruritus. He is generally well, but notes that about 2 weeks ago he had development of pruritus, erythema on his torso.  He was seen and evaluated here, diagnosed with likely allergic reaction, received intramuscular injections, was discharged in stable condition.  He notes that any interval he has seen his primary care team once, is scheduled for dermatology follow-up, but has not yet had that appointment. Patient had a bradycardia during her visit is also scheduled for outpatient cardiology evaluation, but has not had that appointment yet either. Currently denies chest pain, dyspnea, nausea, vomiting.  About 2 hours prior to ED arrival he noticed swelling in his lower lip.  There is some difficulty with speaking, but no difficulty with swallowing.  He is not taking any medication for relief.  He notes that in the interval since he is prior evaluation for allergic reaction, with suspicion of environmental exposure he stated few nights at a family members house.  During that time he had no pruritus, no erythema.  However, upon returning to his apartment he had recurrence of erythema, pruritus, and now lip swelling.  History is obtained by the patient, his wife, using video interpreter service, speaking Arabic.    Past Medical History:  Diagnosis Date  . Congenital heart anomaly   . Heart burn     Patient Active Problem List   Diagnosis Date Noted  . 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 Heart burn 01/23/2021  . Constipation 01/23/2021  . 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 Encounter for health examination of refugee 01/22/2021    No past surgical history on  file.     No family history on file.  Social History   Tobacco Use  . Smoking status: Current Every Day Smoker    Years: 5.00    Types: Cigarettes  . Smokeless tobacco: Never Used  . Tobacco comment: Quit 3 years ago  Vaping Use  . Vaping Use: Never used  Substance Use Topics  . Alcohol use: Never  . Drug use: Never    Home Medications Prior to Admission medications   Medication Sig Start Date End Date Taking? Authorizing Provider  predniSONE (DELTASONE) 20 MG tablet Take 2 tablets (40 mg total) by mouth daily with breakfast. For the next four days 03/07/21  Yes Gerhard Munch, MD  bismuth-metronidazole-tetracycline [facility]) [id number] MG capsule Take 3 capsules by mouth 4 (four) times daily -  before meals and at bedtime for 10 days. Please include instructions in Arabic if possible. 02/26/21 03/08/21  Fayette Pho, MD  diphenhydrAMINE (BENADRYL) 25 MG tablet Take 1 tablet (25 mg total) by mouth 3 (three) times daily. Take one tablet three times daily for two days 03/07/21   Gerhard Munch, MD  famotidine (PEPCID) 20 MG tablet Take 1 tablet (20 mg total) by mouth 2 (two) times daily. 01/23/21   Mullis, Kiersten P, DO  pantoprazole (PROTONIX) 40 MG tablet Take 1 tablet (40 mg total) by mouth 2 (two) times daily for 10 days. 02/26/21 03/08/21  Fayette Pho, MD  polyethylene glycol powder [facility]) 17 GM/SCOOP powder Take 17 g by mouth daily. 01/23/21   Mullis, Kiersten P, DO  triamcinolone ointment (KENALOG) 0.5 % Apply 1 application topically 2 (two) times daily. Until improved. 01/23/21   Mullis, Kiersten P, DO    Allergies    Patient has no known allergies.  Review of Systems   Review of Systems  Constitutional:       Per HPI, otherwise negative  HENT:       Per HPI, otherwise negative  Respiratory:       Per HPI, otherwise negative  Cardiovascular:       Per HPI, otherwise negative  Gastrointestinal: Negative for vomiting.  Endocrine:       Negative aside  from HPI  Genitourinary:       Neg aside from HPI   Musculoskeletal:       Per HPI, otherwise negative  Skin: Negative for color change.  Allergic/Immunologic: Negative for immunocompromised state.  Neurological: Negative for syncope.    Physical Exam Updated Vital Signs BP (!) 128/95 (BP Location: Right Arm)   Pulse (!) 54   Temp 97.8 F (36.6 C) (Oral)   Resp 18   SpO2 100%   Physical Exam Vitals and nursing note reviewed.  Constitutional:      General: He is not in acute distress.    Appearance: He is well-developed.  HENT:     Head: Atraumatic.     Mouth/Throat:     Comments: Symmetric lower lip edema no focal lesions. Oropharynx clear, not edematous, no asymmetry, no lesions Eyes:     Conjunctiva/sclera: Conjunctivae normal.  Cardiovascular:     Rate and Rhythm: Normal rate and regular rhythm.  Pulmonary:     Effort: Pulmonary effort is normal. No respiratory distress.     Breath sounds: No stridor.  Abdominal:     General: There is no distension.  Skin:    General: Skin is warm and dry.     Comments: Patient's torso is without obvious lesion.  He does note that his erythema is waxing, waning, not currently present.  Neurological:     Mental Status: He is alert and oriented to person, place, and time.      ED Results / Procedures / Treatments    Procedures Procedures   Medications Ordered in ED Medications  predniSONE (DELTASONE) tablet 60 mg (60 mg Oral Given 03/07/21 1009)  famotidine (PEPCID) tablet 20 mg (20 mg Oral Given 03/07/21 1010)  diphenhydrAMINE (BENADRYL) capsule 25 mg (25 mg Oral Given 03/07/21 1010)    ED Course  I have reviewed the triage vital signs and the nursing notes.  Pertinent labs & imaging results that were available during my care of the patient were reviewed by me and considered in my medical decision making (see chart for details).   After initial evaluation reviewed the patient's chart, at bedside we discussed his recent  evaluation here, today's urgent care visit prompting ED transfer, and upcoming appointments. He and I, with his wife discussed likelihood of allergic reaction contributing to today's presentation as he has no medication usage suggesting other etiology.  With no airway compromise, no distress, no hemodynamic stability, patient is appropriate for antihistamines, steroids, outpatient follow-up with primary care.  Per his and his wife's request I created note to provide to his landlord for consideration of alternative housing arrangement. MDM Rules/Calculators/A&P MDM Number of Diagnoses or Management Options Allergic reaction, initial encounter: new, needed workup   Amount and/or Complexity of Data Reviewed Decide to obtain previous medical records or to obtain history from someone other than the patient: yes Obtain  history from someone other than the patient: yes Review and summarize past medical records: yes  Risk of Complications, Morbidity, and/or Mortality Presenting problems: high Diagnostic procedures: high Management options: high  Critical Care Total time providing critical care: < 30 minutes  Patient Progress Patient progress: stable  Final Clinical Impression(s) / ED Diagnoses Final diagnoses:  Allergic reaction, initial encounter    Rx / DC Orders ED Discharge Orders         Ordered    predniSONE (DELTASONE) 20 MG tablet  Daily with breakfast        03/07/21 1049    diphenhydrAMINE (BENADRYL) 25 MG tablet  3 times daily,   Status:  Discontinued        03/07/21 1049    diphenhydrAMINE (BENADRYL) 25 MG tablet  3 times daily        03/07/21 1056           Gerhard Munch, MD 03/07/21 1107

## 2021-03-11 NOTE — Progress Notes (Signed)
   Subjective:   Patient ID: Joseph Mueller    DOB: 1981/02/23, 40 y.o. male   MRN: [id number]  Joseph Mueller is a 40 y.o. male with a history of congenital anomalies of aortic arch following with cardiology, pseudofolliculitis, constipation, heart burn here for follow up  HPI: Patient presented on 01/23/21 for refugee examination. His concerns at that time included lesion on scalp thought to be pseudofolliculitis scheduled for dermatology clinic for biopsy, constipation, congenital anomalies of the aortic arch followed by cardiology and scheduled for MRI, and heartburn found to have H. Pylori.  Today he notes he went to the ED recently for allergic reaction. He completed all the medicines and all his symptoms resolved. He had recently started taking a medicine for his stomach (H. Pylori) for 10 days and he completed this with resolution in his symptoms.  Health Maintenance: Health Maintenance Due  Topic   TETANUS/TDAP    COVID-19 Vaccine (3 - Booster for [employer] series)   Review of Systems:  Per HPI.   Objective:   BP 116/64   Pulse 61   Ht 5' 11.46" (1.815 m)   Wt 190 lb 9.6 oz (86.5 kg)   SpO2 98%   BMI 26.24 kg/m  Vitals and nursing note reviewed.  General: pleasant older male, sitting comfortably in exam chair, well nourished, well developed, in no acute distress with non-toxic appearance Resp: breathing comfortably on room air, speaking in full sentences MSK: gait normal Neuro: Alert and oriented, speech normal  Assessment & Plan:   Heart burn Chronic. Improved since treatment of H. Pylori. Recommended Pepcid PRN. Rx provided.  Pseudofolliculitis Chronic. Notes improvement with "antibiotic", possibly referring to the antibiotic in H.pylori treatment.  Discouraged further antibiotics.  He is already scheduled with derm clinic. Follow up sooner if worsening.  Congenital anomalies of aortic arch Cardiac MRI scheduled and follow up with cardiology  scheduled this month. Reminded patient of appointments dates and location   No orders of the defined types were placed in this encounter.  Meds ordered this encounter  Medications   famotidine (PEPCID) 20 MG tablet    Sig: Take 1 tablet (20 mg total) by mouth 2 (two) times daily as needed for heartburn or indigestion.    Dispense:  60 tablet    Refill:  2      Orpah Cobb, DO PGY-3, [facility], [facility] 03/15/2021 8:32 PM

## 2021-03-13 ENCOUNTER — Ambulatory Visit: Payer: Self-pay

## 2021-03-14 ENCOUNTER — Encounter: Payer: Self-pay | Admitting: Family Medicine

## 2021-03-14 ENCOUNTER — Ambulatory Visit (INDEPENDENT_AMBULATORY_CARE_PROVIDER_SITE_OTHER): Payer: Medicaid Other | Admitting: Family Medicine

## 2021-03-14 ENCOUNTER — Other Ambulatory Visit: Payer: Self-pay

## 2021-03-14 DIAGNOSIS — L738 Other specified follicular disorders: Secondary | ICD-10-CM

## 2021-03-14 DIAGNOSIS — R12 Heartburn: Secondary | ICD-10-CM

## 2021-03-14 DIAGNOSIS — Q254 Congenital malformation of aorta unspecified: Secondary | ICD-10-CM

## 2021-03-14 MED ORDER — FAMOTIDINE 20 MG PO TABS: 20.0000 mg | ORAL_TABLET | Freq: Two times a day (BID) | ORAL | 2 refills | Status: DC | PRN

## 2021-03-14 NOTE — Patient Instructions (Addendum)
For your stomach, please take Pepcid as needed.  You have a cardiology appointment on 03/30/21 Dr. Rollene Rotunda at Cardiology on [facility] [address] [place], [place] [address] [phone]  You have a heart MRI on 03/26/21 at 8am at [facility]

## 2021-03-15 NOTE — Assessment & Plan Note (Signed)
Chronic. Notes improvement with "antibiotic", possibly referring to the antibiotic in H.pylori treatment.  Discouraged further antibiotics.  He is already scheduled with derm clinic. Follow up sooner if worsening.

## 2021-03-15 NOTE — Assessment & Plan Note (Signed)
Cardiac MRI scheduled and follow up with cardiology scheduled this month. Reminded patient of appointments dates and location

## 2021-03-15 NOTE — Assessment & Plan Note (Signed)
Chronic. Improved since treatment of H. Pylori. Recommended Pepcid PRN. Rx provided.

## 2021-03-23 ENCOUNTER — Telehealth (HOSPITAL_COMMUNITY): Payer: Self-pay | Admitting: *Deleted

## 2021-03-23 NOTE — Telephone Encounter (Signed)
Attempted to call patient regarding upcoming cardiac MRI appointment via Arabic interpreter [facility] ID # [phone]). Left message on voicemail with name and callback number  Larey Brick RN Navigator Cardiac Imaging [facility] [phone] Office [phone] Cell

## 2021-03-26 ENCOUNTER — Ambulatory Visit (HOSPITAL_COMMUNITY)
Admission: RE | Admit: 2021-03-26 | Discharge: 2021-03-26 | Disposition: A | Discharge status: Home / Self Care | Payer: Medicaid Other | Source: Ambulatory Visit | Attending: Cardiology | Admitting: Cardiology

## 2021-03-26 ENCOUNTER — Other Ambulatory Visit: Payer: Self-pay

## 2021-03-26 ENCOUNTER — Ambulatory Visit (HOSPITAL_COMMUNITY): Admission: RE | Admit: 2021-03-26 | Payer: Medicaid Other | Source: Ambulatory Visit

## 2021-03-26 DIAGNOSIS — Q2549 Other congenital malformations of aorta: Secondary | ICD-10-CM | POA: Diagnosis present

## 2021-03-26 DIAGNOSIS — R931 Abnormal findings on diagnostic imaging of heart and coronary circulation: Secondary | ICD-10-CM

## 2021-03-26 DIAGNOSIS — I420 Dilated cardiomyopathy: Secondary | ICD-10-CM

## 2021-03-26 MED ORDER — GADOBUTROL 1 MMOL/ML IV SOLN
9.0000 mL | Freq: Once | INTRAVENOUS | Status: AC | PRN
  Administered 2021-03-26: 9 mL via INTRAVENOUS

## 2021-04-01 NOTE — Progress Notes (Signed)
Cardiology Office Note   Date:  04/02/2021   ID:  Parnell Deane [place], [place] 1981/06/13, MRN [id number]  PCP:  Joana Reamer, DO  Cardiologist:   None Referring:  Joana Reamer, DO  Chief Complaint  Patient presents with   Congenital Heart Disease       History of Present Illness: Joseph Mueller is a 40 y.o. male who is referred by Joana Reamer, DO for evaluation of an abnormal echo.  He was found to have an ejection fraction of 45 to 50% with global hypokinesis.  He had mildly enlarged right ventricle.  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  An illustration of how this variant might appear the as below.  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  The EF was low normal.  I brought him back to discuss these results.   Since I last saw him he has done well.  The patient denies any new symptoms such as chest discomfort, neck or arm discomfort. There has been no new shortness of breath, PND or orthopnea. There have been no reported palpitations, presyncope or syncope.   The history was obtained through medical interpreter.  He speaks Arabic.         Past Medical History:  Diagnosis Date   Congenital heart anomaly    Heart burn     PSH:  None   Current Outpatient Medications  Medication Sig Dispense Refill   famotidine (PEPCID) 20 MG tablet Take 1 tablet (20 mg total) by mouth 2 (two) times daily as needed for heartburn or indigestion. 60 tablet 2   hydrOXYzine (ATARAX/VISTARIL) 25 MG tablet Take 25 mg by mouth every 6 (six) hours.     polyethylene glycol powder (GLYCOLAX/MIRALAX) 17 GM/SCOOP powder Take 17 g by mouth daily. 3350 g 1   triamcinolone ointment (KENALOG) 0.5 % Apply 1 application topically 2 (two) times daily. Until improved. 60 g 3   No  current facility-administered medications for this visit.    Allergies:   Patient has no known allergies.    ROS:  Please see the history of present illness.   Otherwise, review of systems are positive for none.   All other systems are reviewed and negative.    PHYSICAL EXAM: VS:  BP 118/82   Pulse 66   Ht 6' (1.829 m)   Wt 190 lb 12.8 oz (86.5 kg)   SpO2 99%   BMI 25.88 kg/m  , BMI Body mass index is 25.88 kg/m.-  Blood pressures are equal in both arms GENERAL:  Well appearing NECK:  No jugular venous distention, waveform within normal limits, carotid upstroke brisk and symmetric, no bruits, no thyromegaly LUNGS:  Clear to auscultation bilaterally CHEST:  Unremarkable HEART:  PMI not displaced or sustained,S1 and S2 within normal limits, no S3, no S4, no clicks, no rubs, no murmurs ABD:  Flat, positive bowel sounds normal in frequency in pitch, no bruits, no rebound, no guarding, no midline pulsatile mass, no hepatomegaly, no splenomegaly EXT:  2 plus pulses throughout, no edema, no cyanosis no clubbing  MRI:  1. Low normal left ventricular systolic function with normal left ventricular chamber size. LVEF 51%. Prominent left ventricular lateral and apical trabeculations that do not meet criteria for noncompaction.   2.  Normal right ventricular systolic function with normal right ventricular chamber size. RVEF 56%.   3. 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 Immediately distal to this area the proximal descending aorta measures 36 mm, the descending thoracic aorta is mildly dilated and measures 33 mm at the mid level.   4. Venous anatomy appears normal. No left SVC is visualized, no anomalous venous drainage is identified.   5. There  is no post contrast delayed myocardial enhancement to suggest, scar, fibrosis, inflammation, infarction or infiltration. Normal ECV.      EKG:  EKG is not ordered today. The ekg ordered today demonstrates sinus rhythm, rate   Recent Labs: 01/23/2021: TSH 2.320 02/21/2021: ALT 39; BUN 11; Creatinine, Ser 0.79; Hemoglobin 13.8; Platelets 172; Potassium 3.7; Sodium 139    Lipid Panel    Component Value Date/Time   CHOL 227 (H) 01/23/2021 1435   TRIG 107 01/23/2021 1435   HDL 40 01/23/2021 1435   CHOLHDL 5.7 (H) 01/23/2021 1435   LDLCALC 168 (H) 01/23/2021 1435      Wt Readings from Last 3 Encounters:  04/02/21 190 lb 12.8 oz (86.5 kg)  03/14/21 190 lb 9.6 oz (86.5 kg)  02/22/21 191 lb 3.2 oz (86.7 kg)      Other studies Reviewed: Additional studies/ records that were reviewed today include: MRI Review of the above records demonstrates:  Please see elsewhere in the note.     ASSESSMENT AND PLAN:  ABNORMAL AORTIC ARCH:    He has anatomy as described.  No change in therapy is indicated.    I went over this in great detail with the patient showing him normal anatomy and his anatomy.  He has no further questions.  No change in therapy.   CARDIOMYOPATHY:    EF is low normal.  I will check another echocardiogram in 1 year before seeing him back.  Currently he has no symptoms.  Current medicines are reviewed at length with the patient today.  The patient does not have concerns regarding medicines.  The following changes have been made:  None  Labs/ tests ordered today include:   Orders Placed This Encounter  Procedures   ECHOCARDIOGRAM COMPLETE      Disposition:   FU with me after the echo next year.     Signed, Rollene Rotunda, MD  04/02/2021 12:06 PM    [facility]

## 2021-04-02 ENCOUNTER — Encounter: Payer: Self-pay | Admitting: Cardiology

## 2021-04-02 ENCOUNTER — Ambulatory Visit (INDEPENDENT_AMBULATORY_CARE_PROVIDER_SITE_OTHER): Payer: Medicaid Other | Admitting: Cardiology

## 2021-04-02 ENCOUNTER — Other Ambulatory Visit: Payer: Self-pay

## 2021-04-02 ENCOUNTER — Other Ambulatory Visit: Payer: Medicaid Other

## 2021-04-02 VITALS — BP 118/82 | HR 66 | Ht 72.0 in | Wt 190.8 lb

## 2021-04-02 DIAGNOSIS — Q2549 Other congenital malformations of aorta: Secondary | ICD-10-CM | POA: Diagnosis not present

## 2021-04-02 DIAGNOSIS — I420 Dilated cardiomyopathy: Secondary | ICD-10-CM | POA: Diagnosis not present

## 2021-04-02 DIAGNOSIS — A048 Other specified bacterial intestinal infections: Secondary | ICD-10-CM

## 2021-04-02 NOTE — Patient Instructions (Signed)
  Testing/Procedures:  Your physician has requested that you have an echocardiogram. Echocardiography is a painless test that uses sound waves to create images of your heart. It provides your doctor with information about the size and shape of your heart and how well your heart's chambers and valves are working. This procedure takes approximately one hour. There are no restrictions for this procedure. [address]-SCHEDULE IN 1 YEAR   Follow-Up: At [facility], you and your health needs are our priority.  As part of our continuing mission to provide you with exceptional heart care, we have created designated Provider Care Teams.  These Care Teams include your primary Cardiologist (physician) and Advanced Practice Providers (APPs -  Physician Assistants and Nurse Practitioners) who all work together to provide you with the care you need, when you need it.  We recommend signing up for the patient portal called "MyChart".  Sign up information is provided on this After Visit Summary.  MyChart is used to connect with patients for Virtual Visits (Telemedicine).  Patients are able to view lab/test results, encounter notes, upcoming appointments, etc.  Non-urgent messages can be sent to your provider as well.   To learn more about what you can do with MyChart, go to [email or web address].    Your next appointment:   12 month(s)  The format for your next appointment:   In Person  Provider:   Rollene Rotunda, MD

## 2021-04-03 ENCOUNTER — Ambulatory Visit: Payer: Medicaid Other

## 2021-04-03 NOTE — Progress Notes (Deleted)
     SUBJECTIVE:   CHIEF COMPLAINT / HPI:   Joseph Mueller is a 40 y.o. male presents for allergic reaction    ***  Flowsheet Row Office Visit from 03/14/2021 in [facility]  PHQ-9 Total Score 3        Health Maintenance Due  Topic   TETANUS/TDAP    COVID-19 Vaccine (3 - Booster for [employer] series)      PERTINENT  PMH / PSH:   OBJECTIVE:   There were no vitals taken for this visit.   General: Alert, no acute distress Cardio: Normal S1 and S2, RRR, no r/m/g Pulm: CTAB, normal work of breathing Abdomen: Bowel sounds normal. Abdomen soft and non-tender.  Extremities: No peripheral edema.  Neuro: Cranial nerves grossly intact   ASSESSMENT/PLAN:   No problem-specific Assessment & Plan notes found for this encounter.     Towanda Octave, MD PGY-2 [facility]

## 2021-04-04 LAB — H. PYLORI BREATH TEST: H pylori Breath Test: NEGATIVE

## 2021-04-05 ENCOUNTER — Ambulatory Visit (INDEPENDENT_AMBULATORY_CARE_PROVIDER_SITE_OTHER): Payer: Medicaid Other | Admitting: Family Medicine

## 2021-04-05 ENCOUNTER — Other Ambulatory Visit: Payer: Self-pay

## 2021-04-05 VITALS — BP 110/60 | HR 60 | Ht 72.0 in | Wt 191.2 lb

## 2021-04-05 DIAGNOSIS — L731 Pseudofolliculitis barbae: Secondary | ICD-10-CM

## 2021-04-05 DIAGNOSIS — L738 Other specified follicular disorders: Secondary | ICD-10-CM

## 2021-04-05 DIAGNOSIS — J302 Other seasonal allergic rhinitis: Secondary | ICD-10-CM

## 2021-04-05 DIAGNOSIS — T7840XA Allergy, unspecified, initial encounter: Secondary | ICD-10-CM

## 2021-04-05 MED ORDER — LORATADINE 10 MG PO TABS: 10.0000 mg | ORAL_TABLET | Freq: Every day | ORAL | 11 refills | Status: DC

## 2021-04-05 MED ORDER — CLINDAMYCIN PHOS-BENZOYL PEROX 1.2-5 % EX GEL
1.0000 | Freq: Two times a day (BID) | CUTANEOUS | 0 refills | Status: DC
Start: 2021-04-05 — End: 2021-07-26

## 2021-04-05 NOTE — Assessment & Plan Note (Signed)
Patient with history of lip swelling, rash when trees have lots of pollen. No red flag signs. Discussed importance of seeking out immediate help if signs of anaphylaxis. Can do trial of claritin 10 mg daily prn. Recommend if occurs again for patient to schedule appointment so it can be evaluated by physician when actively occurring. Can consider referral to allergist if persistent.

## 2021-04-05 NOTE — Patient Instructions (Addendum)
??? ?? ????? ??????? ?????. ??? ??? ????? ?? ????? ????. ???? ?? ???? ?? ???? ??????? ?????? ????? ?????? ???? ????? ?????. ????? ?? ?????? ??????? ???? ?? ??????.  ?????? ??? ?????? ???? ??? ???????? ??? ??????? ????? ?????? ??? ???? ?????. ?????? ??? ???? ?????? ??? ?????? ??????? ????.   kan min aljayid muqabalatuk alyawma. asaf ealaa alafat fi muakharat rasaka. yabdu 'ana ladayk ma yusamaa biailtihab jaribat alshier alkadhib bisabab halaaqat alshier. sanuqadim lak Luan Moore yumkin 'an [place]. aistakhdim hadhih alkamiyat bihajm habat albazala' ealaa almintaqat maratayn ywmyan hataa tusbih safiatan. Gaylyn Lambert 'ukhraa qryban 'iidha aistamarat al'aerad ladayka.   It was nice meeting you today. Sorry about the lesion at the back of your head. 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 We will give you a topical cream which can help. Use this a pea-sized amount on the area twice a day until clear. See [place] back soon if you still have symptoms.  If you have lip swelling again, you can take claritin 10 mg tablets. Take these once daily for allergies, or you can take it if you have lip swelling. If you ever have shortness of breath with lip swelling, please got to the emergency room.  ??? ????? ?? ???? ?????? ??? ???? ? ????? ????? ????? ???????? 10 ???. ???? ??? ????? ?????? ???????? ? ?? ????? ??????? ??? ??? ???? ???? ?? ??????. ??? ??? ????? ?? ??? ?? ?????? ?? ?????? ?? ?????? ? ?? ???? ???? ??? ???? ???????. 'iidha eanayt min tawarum alshifah maratan 'ukhraa , [place] tanawul 'aqras klariatayn 10 mulghi. Vangie Bicker maratan wahidatan ywmyan lilhasaasiat , 'aw [place] tanawuluha 'iidha kan ladayk tawarum fi alshafahi. 'iidha kunt tueani min diq fi altanafus mae aintifakh fi alshifah , min fadlik aidhhab 'iilaa ghurfat altawarii.

## 2021-04-05 NOTE — Assessment & Plan Note (Signed)
Treat with clindamycin-benzoyl peroxide 1-5% BID until clear. Return to care if no improvement.

## 2021-04-05 NOTE — Progress Notes (Signed)
    SUBJECTIVE:   CHIEF COMPLAINT / HPI: bumps on back of head  40 yo man presents for evaluation of bumps on back of his head. These have been present for 4 or 5 years, they are sometimes itchy, worse after getting his hair cut. He is of African descent. He has tried triamcinolone cr without improvement. He denise pain, redness, or pustulant drainage from these areas.   Allergy question: patient reports that when the trees have pollen he sometimes will get transiently swollen lips or a rash on his skin. He does not have this currently happening now, no pictures. He denies SOB or trouble breathing during these episodes.   PERTINENT  PMH / PSH: refugee status, Arabic speaking  OBJECTIVE:   BP 110/60   Pulse 60   Ht 6' (1.829 m)   Wt 191 lb 3.2 oz (86.7 kg)   SpO2 99%   BMI 25.93 kg/m   Nursing note and vitals reviewed GEN: age-appropriate, AM, resting comfortably in chair, NAD, WNWD, alert and at baseline Skin: scattered 2-69mm papules along occipital scalp, no erythema, edema, warmth or drainage Psych: Pleasant and appropriate    ASSESSMENT/PLAN:   Pseudofolliculitis Treat with clindamycin-benzoyl peroxide 1-5% BID until clear. Return to care if no improvement.  Allergy Patient with history of lip swelling, rash when trees have lots of pollen. No red flag signs. Discussed importance of seeking out immediate help if signs of anaphylaxis. Can do trial of claritin 10 mg daily prn. Recommend if occurs again for patient to schedule appointment so it can be evaluated by physician when actively occurring. Can consider referral to allergist if persistent.     Shirlean Mylar, MD [facility]

## 2021-07-25 NOTE — Progress Notes (Signed)
    SUBJECTIVE:   CHIEF COMPLAINT / HPI:   Allergies: Patient reports seasonal allergies, sx improve with loratidine. Refill given.  Folliculitis barbae: patient completed clindamycin peroxide gel use.  He reports that he had improvement in his folliculitis barbae on his occiput.  However he has run out of medication.  Chest pain: Patient reports nightly chest pain. This is a tight pressure that is uncomfortable for several minutes and goes away and comes back, it is only present at night. This has been present for about 5-6 months. 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 He denies orthopnea, syncope, palpitations. He denies a sour taste in his mouth, no burning sensation. 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 He currently does not have any CP.  PERTINENT  PMH / PSH: folliculitis barbae  OBJECTIVE:   BP 100/70   Pulse 68   Ht 6' (1.829 m)   Wt 196 lb 2 oz (89 kg)   SpO2 98%   BMI 26.60 kg/m   Nursing note and vitals reviewed GEN: age-appropriate AAM, resting comfortably in chair, NAD, WNWD Cardiac: Regular rate and rhythm. Normal S1/S2. No murmurs, rubs, or gallops appreciated. 2+ radial pulses. Lungs: Clear bilaterally to ascultation. No increased WOB, no accessory muscle usage. No w/r/r. Neuro: AOx3  Skin: 2 67mm flesh colored papules on occiput Ext: no edema Psych: Pleasant and appropriate   ASSESSMENT/PLAN:   Allergy Patient reports allergy symptoms such as congestion, eye redness/watering, that is worse in the fall and spring. Requests refill of loratidine, given.  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 Will refill. Return to care if no improvement.  Chest pain 40 yo man presents with ~5 months of intermittent chest pain that is  only present at night, substernal, no radiation. He denies orthopnea, dyspnea, syncope, palpitations. Reassuringly, he works in [employer] and he has no symptoms at work. He has been seen by cardiology and has low normal EF 40-50% on echo, abnormal anatomy (Kommerell's diverticulum) from cardiac MRI. Counseled patient on return precautions, made appt for him with cardiology on 11/4 (earliest available). Since this has been the same CP present for ~5 months, not present during exertion, feel that 2 week wait for follow up is appropriate at this time. Patient denied GERD symptoms, but will empirically treat for GERD with famotidine 40 mg qd to assess for improvement while awaiting cardiology follow up. Follow up with me as needed.      Shirlean Mylar, MD [facility]

## 2021-07-26 ENCOUNTER — Telehealth: Payer: Self-pay | Admitting: Cardiology

## 2021-07-26 ENCOUNTER — Other Ambulatory Visit: Payer: Self-pay

## 2021-07-26 ENCOUNTER — Ambulatory Visit: Payer: Medicaid Other | Admitting: Family Medicine

## 2021-07-26 VITALS — BP 100/70 | HR 68 | Ht 72.0 in | Wt 196.1 lb

## 2021-07-26 DIAGNOSIS — J302 Other seasonal allergic rhinitis: Secondary | ICD-10-CM | POA: Diagnosis not present

## 2021-07-26 DIAGNOSIS — T7840XA Allergy, unspecified, initial encounter: Secondary | ICD-10-CM

## 2021-07-26 DIAGNOSIS — L738 Other specified follicular disorders: Secondary | ICD-10-CM | POA: Diagnosis not present

## 2021-07-26 DIAGNOSIS — R079 Chest pain, unspecified: Secondary | ICD-10-CM | POA: Diagnosis not present

## 2021-07-26 DIAGNOSIS — L731 Pseudofolliculitis barbae: Secondary | ICD-10-CM | POA: Diagnosis not present

## 2021-07-26 DIAGNOSIS — K219 Gastro-esophageal reflux disease without esophagitis: Secondary | ICD-10-CM | POA: Diagnosis not present

## 2021-07-26 MED ORDER — FAMOTIDINE 40 MG PO TABS: 40.0000 mg | ORAL_TABLET | Freq: Every day | ORAL | 3 refills | Status: AC

## 2021-07-26 MED ORDER — LORATADINE 10 MG PO TABS: 10.0000 mg | ORAL_TABLET | Freq: Every day | ORAL | 11 refills | Status: AC

## 2021-07-26 MED ORDER — CLINDAMYCIN PHOS-BENZOYL PEROX 1.2-5 % EX GEL
1.0000 | Freq: Two times a day (BID) | CUTANEOUS | 3 refills | Status: AC
Start: 2021-07-26 — End: ?

## 2021-07-26 NOTE — Patient Instructions (Addendum)
It was a pleasure to see you today!  You have an appointment with the cardiologist on 08/10/2021, Friday, at 8:50 AM for chest pain If you have shortness of breath while walking or chest pain that does not get better or happens not at night (for example while at work), please call our office and go to the nearest emergency department for evaluation Please take famotidine (pepcid) 40 mg once a day for 30 days to see if this helps your chest pain For the bumps on the back of your head: use clindamycin gel twice a day (morning and night). Be sure to have a white pillow case as this may stain dark fabrics. I have refilled the allergy medicine loratidine Follow up as needed   Be Well,  Dr. Leary Roca  ??? ?? ????? ????? ?? ???? ?????!  1. ???? ???? ?? ???? ????? ?????? 08/10/2021 ??? ?????? ?????? 8:50 ?????? ???? ????? 2. ??? ??? ????? ?? ??? ?? ?????? ????? ????? ?? ??? ?? ????? ?? ????? ?? ?? ???? ?? ????? (??? ???? ?????? ????? ?????) ? ???? ??????? ??????? ??????? ??? ???? ??? ????? ??????? 3. ???? ????? ????????? (??????) 40 ??? ??? ????? ?????? ???? 30 ????? ?????? ?? ??? ??? ??? ????? ?? ??? ???? 4. ???????? ??? ????? ????: ?????? ???????????? ?? ????? ?? ????? (????? ?????). ???? ?? ???? ??? ????? ???? ??? ?? ???? ??? ??? ????? ??????? ???????. 5. ??? ??? ?????? ????? ???? ???????? ????????? 6. ???????? ??? ??????   ?? ????  ????? ?????? kan min dawaei sururi 'an 'arak alyawma! 1. ladayk maweid mae tabib alqalb bitarikh 08/10/2021 yawm aljumueat alsaaeat 8:50 sbahaan li'alam alsadr 2. 'iidha kunt tueani min diq fi altanafus 'athna' almashy 'aw 'alam fi alsadr la yatahasan 'aw la yahduth fi allayl (ealaa sabil almithal 'athna' aleumli) , yurjaa alaitisal bimaktabina waldhahab 'iilaa 'aqrab qism tawari liltaqyim 3. yurjaa tanawul famutidin (bibsid) 40 majama maratan wahidatan ywmyan limudat 30 ywman limaerifat ma 'iidha kan hadha yusaeid fi 'alam sadrik 4. lilnutu'at ealaa muakharat  rasuka: aistakhdim kilindamaysin jala maratayn fi alyawm (sbahan wamusa'i). ta'akad min wujud kis wisadat 'abyad hayth qad yuadiy dhalik 'iilaa taltikh al'aqmishat aldaakinati. 5. laqad qumt bi'iieadat taebiat dawa' alhasaasiat liwratidin 6. almutabaeat hasab alhaja kun jid, duktur [employer]

## 2021-07-26 NOTE — Assessment & Plan Note (Signed)
Patient had improvement with clinda-benzoyl peroxide 1-5% BID. Will refill. Return to care if no improvement.

## 2021-07-26 NOTE — Telephone Encounter (Signed)
Pt c/o of Chest Pain: STAT if CP now or developed within 24 hours  1. Are you having CP right now?  Unsure   2. Are you experiencing any other symptoms (ex. SOB, nausea, vomiting, sweating)?  Per Joseph Mueller, patient t didn't mention additional symptoms  3. How long have you been experiencing CP?  Several months on and off   4. Is your CP continuous or coming and going?  Coming and goes   5. Have you taken Nitroglycerin?  No, patient doesn't have any nitro  Patient has been scheduled for 11/04 with Joseph Fabian, NP.  ?

## 2021-07-26 NOTE — Telephone Encounter (Signed)
Discussed with Dr Antoine Poche and pt has appt in November and will be evaluated at that time based on current symptoms  ./cy

## 2021-07-26 NOTE — Telephone Encounter (Signed)
Pt was just seen this am at PCP with complaints of chest pain: Patient reports nightly chest pain. This is a tight pressure that is uncomfortable for several minutes and goes away and comes back, it is only present at night. This has been present for about 6 months. 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 He denies orthopnea, syncope, palpitations. He denies a sour taste in his mouth, no burning sensation.  Pt has appt on 08/10/21 at 8:50 with Edd Fabian NP .

## 2021-07-26 NOTE — Assessment & Plan Note (Addendum)
40 yo man presents with ~5 months of intermittent chest pain that is only present at night, substernal, no radiation. He denies orthopnea, dyspnea, syncope, palpitations. Reassuringly, he works in [employer] and he has no symptoms at work. He has been seen by cardiology and has low normal EF 40-50% on echo, abnormal anatomy (Kommerell's diverticulum) from cardiac MRI. Counseled patient on return precautions, made appt for him with cardiology on 11/4 (earliest available). Since this has been the same CP present for ~5 months, not present during exertion, feel that 2 week wait for follow up is appropriate at this time. Patient denied GERD symptoms, but will empirically treat for GERD with famotidine 40 mg qd to assess for improvement while awaiting cardiology follow up. Follow up with me as needed.

## 2021-07-26 NOTE — Assessment & Plan Note (Signed)
Patient reports allergy symptoms such as congestion, eye redness/watering, that is worse in the fall and spring. Requests refill of loratidine, given.

## 2021-08-08 NOTE — Progress Notes (Deleted)
Cardiology Clinic Note   Patient Name: Joseph Mueller Date of Encounter: 08/08/2021  Primary Care Provider:  Shirlean Mylar, MD Primary Cardiologist:  None  Patient Profile    ***  Past Medical History    Past Medical History:  Diagnosis Date   Congenital heart anomaly    Heart burn    No past surgical history on file.  Allergies  No Known Allergies  History of Present Illness    ***  Home Medications    Prior to Admission medications   Medication Sig Start Date End Date Taking? Authorizing Provider  Clindamycin-Benzoyl Per, Refr, gel Apply 1 Pump topically 2 (two) times daily. Use one pea-sized amount on affected areas 2 times daily until clear. 07/26/21   Shirlean Mylar, MD  famotidine (PEPCID) 40 MG tablet Take 1 tablet (40 mg total) by mouth daily. 07/26/21   Shirlean Mylar, MD  hydrOXYzine (ATARAX/VISTARIL) 25 MG tablet Take 25 mg by mouth every 6 (six) hours. 03/07/21   [provider]  loratadine (CLARITIN) 10 MG tablet Take 1 tablet (10 mg total) by mouth daily. 07/26/21   Shirlean Mylar, MD  polyethylene glycol powder [facility]) 17 GM/SCOOP powder Take 17 g by mouth daily. 01/23/21   Mullis, Kiersten P, DO  triamcinolone ointment (KENALOG) 0.5 % Apply 1 application topically 2 (two) times daily. Until improved. 01/23/21   Mullis, Dara Lords, DO    Family History    No family history on file. He indicated that his mother is deceased. He indicated that his father is deceased.  Social History    Social History   Socioeconomic History   Marital status: Married    Spouse name: Not on file   Number of children: Not on file   Years of education: Not on file   Highest education level: Not on file  Occupational History   Not on file  Tobacco Use   Smoking status: Former    Years: 5.00    Types: Cigarettes   Smokeless tobacco: Never   Tobacco comments:    Quit 3 years ago  Vaping Use   Vaping Use: Never used   Substance and Sexual Activity   Alcohol use: Never   Drug use: Never   Sexual activity: Not on file  Other Topics Concern   Not on file  Social History Narrative   Lives here with his wife and 4 children   Social Determinants of Health   Financial Resource Strain: Not on file  Food Insecurity: Not on file  Transportation Needs: Not on file  Physical Activity: Not on file  Stress: Not on file  Social Connections: Not on file  Intimate Partner Violence: Not on file     Review of Systems    General:  No chills, fever, night sweats or weight changes.  Cardiovascular:  No chest pain, dyspnea on exertion, edema, orthopnea, palpitations, paroxysmal nocturnal dyspnea. Dermatological: No rash, lesions/masses Respiratory: No cough, dyspnea Urologic: No hematuria, dysuria Abdominal:   No nausea, vomiting, diarrhea, bright red blood per rectum, melena, or hematemesis Neurologic:  No visual changes, wkns, changes in mental status. All other systems reviewed and are otherwise negative except as noted above.  Physical Exam    VS:  There were no vitals taken for this visit. , BMI There is no height or weight on file to calculate BMI. GEN: Well nourished, well developed, in no acute distress. HEENT: normal. Neck: Supple, no JVD, carotid bruits, or masses. Cardiac: RRR, no murmurs,  rubs, or gallops. No clubbing, cyanosis, edema.  Radials/DP/PT 2+ and equal bilaterally.  Respiratory:  Respirations regular and unlabored, clear to auscultation bilaterally. GI: Soft, nontender, nondistended, BS + x 4. MS: no deformity or atrophy. Skin: warm and dry, no rash. Neuro:  Strength and sensation are intact. Psych: Normal affect.  Accessory Clinical Findings    Recent Labs: 01/23/2021: TSH 2.320 02/21/2021: ALT 39; BUN 11; Creatinine, Ser 0.79; Hemoglobin 13.8; Platelets 172; Potassium 3.7; Sodium 139   Recent Lipid Panel    Component Value Date/Time   CHOL 227 (H) 01/23/2021 1435   TRIG 107  01/23/2021 1435   HDL 40 01/23/2021 1435   CHOLHDL 5.7 (H) 01/23/2021 1435   LDLCALC 168 (H) 01/23/2021 1435    ECG personally reviewed by me today- *** - No acute changes  Assessment & Plan   1.  ***   Jossie Ng. Marshayla Mitschke NP-C    08/08/2021, 1:18 PM [facility] [place] [address] Office [phone] Fax [phone]  Notice: This dictation was prepared with Dragon dictation along with smaller phrase technology. Any transcriptional errors that result from this process are unintentional and may not be corrected upon review.  I spent***minutes examining this patient, reviewing medications, and using patient centered shared decision making involving her cardiac care.  Prior to her visit I spent greater than 20 minutes reviewing her past medical history,  medications, and prior cardiac tests.

## 2021-08-10 ENCOUNTER — Ambulatory Visit: Payer: Medicaid Other | Admitting: General Practice

## 2021-11-10 NOTE — Progress Notes (Deleted)
Cardiology Clinic Note   Patient Name: Joseph Mueller Date of Encounter: 11/10/2021  Primary Care Provider:  Gladys Damme, MD Primary Cardiologist:  None  Patient Profile    [place] 41 year old male presents the clinic today for follow-up evaluation of his chest discomfort. Past Medical History    Past Medical History:  Diagnosis Date   Congenital heart anomaly    Heart burn    No past surgical history on file.  Allergies  No Known Allergies  History of Present Illness    Joseph Mueller has a PMH of aortic arch congenital anomalies, pseudofolliculitis, heartburn, constipation, and chest discomfort.  He was seen and evaluated by Dr. Percival Spanish 04/02/2021.  He had been referred by his PCP for evaluation after abnormal echocardiogram.  He was found to have an ejection fraction of 45-50% with global hypokinesis.  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  He had a MRI which demonstrated diverticulum with, intake of the left carotid from the origin of the right.  His ejection fraction was low normal.  The results were reviewed.  He continued to be stable from a cardiac standpoint.  He denied chest discomfort, shortness of breath, orthopnea, and PND.  He denied palpitations, presyncope, syncope.  A medical interpreter speaking Arabic was used for the clinic evaluation at visit.  His EKG at that time showed sinus rhythm.  Medication regimen was unchanged and a 1 year echocardiogram was planned.  He contacted the nurse triage line on 07/26/2021.  During that time he reported several months of occasional chest discomfort.  He indicated that the discomfort would come and go.  He was scheduled for an appointment 08/10/2021 and did not present.  He presents to the clinic today for follow-up evaluation states***  *** denies chest pain, shortness of breath, lower extremity edema, fatigue, palpitations,  melena, hematuria, hemoptysis, diaphoresis, weakness, presyncope, syncope, orthopnea, and PND.   Home Medications    Prior to Admission medications   Medication Sig Start Date End Date Taking? Authorizing Provider  Clindamycin-Benzoyl Per, Refr, gel Apply 1 Pump topically 2 (two) times daily. Use one pea-sized amount on affected areas 2 times daily until clear. 07/26/21   Gladys Damme, MD  famotidine (PEPCID) 40 MG tablet Take 1 tablet (40 mg total) by mouth daily. 07/26/21   Gladys Damme, MD  hydrOXYzine (ATARAX/VISTARIL) 25 MG tablet Take 25 mg by mouth every 6 (six) hours. 03/07/21   [provider]  loratadine (CLARITIN) 10 MG tablet Take 1 tablet (10 mg total) by mouth daily. 07/26/21   Gladys Damme, MD  polyethylene glycol powder [facility]) 17 GM/SCOOP powder Take 17 g by mouth daily. 01/23/21   Mullis, Kiersten P, DO  triamcinolone ointment (KENALOG) 0.5 % Apply 1 application topically 2 (two) times daily. Until improved. 01/23/21   Mullis, Archie Endo, DO    Family History    No family history on file. He indicated that his mother is deceased. He indicated that his father is deceased.  Social History    Social History   Socioeconomic History   Marital status: Married    Spouse name: Not on file   Number of children: Not on file   Years of education: Not on file   Highest education level: Not on file  Occupational History   Not on file  Tobacco Use   Smoking status: Former    Years: 5.00    Types:  Cigarettes   Smokeless tobacco: Never   Tobacco comments:    Quit 3 years ago  Vaping Use   Vaping Use: Never used  Substance and Sexual Activity   Alcohol use: Never   Drug use: Never   Sexual activity: Not on file  Other Topics Concern   Not on file  Social History Narrative   Lives here with his wife and 4 children   Social Determinants of Health   Financial Resource Strain: Not on file  Food Insecurity: Not on file  Transportation  Needs: Not on file  Physical Activity: Not on file  Stress: Not on file  Social Connections: Not on file  Intimate Partner Violence: Not on file     Review of Systems    General:  No chills, fever, night sweats or weight changes.  Cardiovascular:  No chest pain, dyspnea on exertion, edema, orthopnea, palpitations, paroxysmal nocturnal dyspnea. Dermatological: No rash, lesions/masses Respiratory: No cough, dyspnea Urologic: No hematuria, dysuria Abdominal:   No nausea, vomiting, diarrhea, bright red blood per rectum, melena, or hematemesis Neurologic:  No visual changes, wkns, changes in mental status. All other systems reviewed and are otherwise negative except as noted above.  Physical Exam    VS:  There were no vitals taken for this visit. , BMI There is no height or weight on file to calculate BMI. GEN: Well nourished, well developed, in no acute distress. HEENT: normal. Neck: Supple, no JVD, carotid bruits, or masses. Cardiac: RRR, no murmurs, rubs, or gallops. No clubbing, cyanosis, edema.  Radials/DP/PT 2+ and equal bilaterally.  Respiratory:  Respirations regular and unlabored, clear to auscultation bilaterally. GI: Soft, nontender, nondistended, BS + x 4. MS: no deformity or atrophy. Skin: warm and dry, no rash. Neuro:  Strength and sensation are intact. Psych: Normal affect.  Accessory Clinical Findings    Recent Labs: 01/23/2021: TSH 2.320 02/21/2021: ALT 39; BUN 11; Creatinine, Ser 0.79; Hemoglobin 13.8; Platelets 172; Potassium 3.7; Sodium 139   Recent Lipid Panel    Component Value Date/Time   CHOL 227 (H) 01/23/2021 1435   TRIG 107 01/23/2021 1435   HDL 40 01/23/2021 1435   CHOLHDL 5.7 (H) 01/23/2021 1435   LDLCALC 168 (H) 01/23/2021 1435    ECG personally reviewed by me today- *** - No acute changes  Echocardiogram 02/02/2021  IMPRESSIONS     1. Left ventricular ejection fraction, by estimation, is 45 to 50%. The  left ventricle has mildly decreased  function. The left ventricle  demonstrates global hypokinesis. There is mild left ventricular  hypertrophy. Left ventricular diastolic parameters  were normal.   2. Right ventricular systolic function is normal. The right ventricular  size is mildly enlarged. There is normal pulmonary artery systolic  pressure. The estimated right ventricular systolic pressure is 26.0 mmHg.   3. The mitral valve is normal in structure. Trivial mitral valve  regurgitation.   4. The aortic valve is tricuspid. Aortic valve regurgitation is mild. No  aortic stenosis is present.   5. The inferior vena cava is normal in size with greater than 50%  respiratory variability, suggesting right atrial pressure of 3 mmHg.   6. 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  Cardiac MRI 03/26/2021 IMPRESSION: Significant respiratory motion artifact impacts the anatomic assessment of both LV trabeculations as well as aortic arch and vascular anatomy.   1. Low normal left ventricular systolic function with normal left ventricular chamber size. LVEF 51%. Prominent left ventricular lateral and apical trabeculations that do not meet criteria for noncompaction.   2. Normal right ventricular systolic function with normal right ventricular chamber size. RVEF 56%.   3. 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 Immediately distal to this area  the proximal descending aorta measures 36 mm, the descending thoracic aorta is mildly dilated and measures 33 mm at the mid level.   4. Venous anatomy appears normal. No left SVC is visualized, no anomalous venous drainage is identified.   5. There is no post contrast delayed myocardial enhancement to suggest, scar, fibrosis, inflammation, infarction or infiltration. Normal ECV. Assessment & Plan   1.  Abnormal aortic arch-prior echocardiogram and cardiac MRI reviewed.  Continues to be stable from a cardiac standpoint. Repeat echocardiogram 4/23 Heart healthy low-sodium diet Increase physical activity as tolerated  Cardiomyopathy-denies increased DOE or activity intolerance. Cardiac MRI showed low normal ejection fraction. Heart healthy low-sodium diet Increase physical activity as tolerated  Disposition: Follow-up with Dr. Percival Spanish or me in 9-12 months.   Jossie Ng. Aryn Safran NP-C    11/10/2021, 4:46 PM [facility] [place] [address] Office [phone] Fax [phone]  Notice: This dictation was prepared with Dragon dictation along with smaller phrase technology. Any transcriptional errors that result from this process are unintentional and may not be corrected upon review.  I spent***minutes examining this patient, reviewing medications, and using patient centered shared decision making involving her cardiac care.  Prior to her visit I spent greater than 20 minutes reviewing her past medical history,  medications, and prior cardiac tests.

## 2021-11-12 ENCOUNTER — Ambulatory Visit: Payer: Medicaid Other | Admitting: General Practice

## 2021-12-26 NOTE — Progress Notes (Incomplete)
?Cardiology Clinic Note  ? ?Patient Name: Joseph Mueller ?Date of Encounter: 12/26/2021 ? ?Primary Care Provider:  Gladys Damme, MD ?Primary Cardiologist:  None ? ?Patient Profile  ?  ?41 year old Arabic male who requires an interpreter, male with reduced EF of 45 to 50% with global hypokinesis and mildly enlarged right ventricle per echocardiogram in 2022, he was also noted to have a right-sided aortic arch with aberrant left subclavian artery, and  Kommerell's diverticulum.  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  His EF was normal. ? ?Past Medical History  ?  ?Past Medical History:  ?Diagnosis Date  ? Congenital heart anomaly   ? Heart burn   ? ?No past surgical history on file. ? ?Allergies ? ?No Known Allergies ? ?History of Present Illness  ?  ?Joseph Mueller is a 41 year old male with above-mentioned history of congenital heart disease, revealing abnormal aortic arch, cardiomyopathy, per echocardiogram and a cardiac MRI completed in 03/26/2021 who was last seen by Joseph Mueller on 04/02/2021.  At that time Joseph Mueller went over his coronary anatomy in great detail.  A repeat echocardiogram was ordered for the following year.  No new medications were ordered.  ? ?Home Medications  ?  ?Current Outpatient Medications  ?Medication Sig Dispense Refill  ? Clindamycin-Benzoyl Per, Refr, gel Apply 1 Pump topically 2 (two) times daily. Use one pea-sized amount on affected areas 2 times daily until clear. 45 g 3  ? famotidine (PEPCID) 40 MG tablet Take 1 tablet (40 mg total) by mouth daily. 30 tablet 3  ? hydrOXYzine (ATARAX/VISTARIL) 25 MG tablet Take 25 mg by mouth every 6 (six) hours.    ? loratadine (CLARITIN) 10 MG tablet Take 1 tablet (10 mg total) by mouth daily. 30 tablet 11  ? polyethylene glycol powder (GLYCOLAX/MIRALAX) 17 GM/SCOOP powder Take 17 g by mouth daily. 3350 g 1  ? triamcinolone ointment (KENALOG) 0.5 % Apply 1  application topically 2 (two) times daily. Until improved. 60 g 3  ? ?No current facility-administered medications for this visit.  ?  ? ?Family History  ?  ?No family history on file. ?He indicated that his mother is deceased. He indicated that his father is deceased. ? ?Social History  ?  ?Social History  ? ?Socioeconomic History  ? Marital status: Married  ?  Spouse name: Not on file  ? Number of children: Not on file  ? Years of education: Not on file  ? Highest education level: Not on file  ?Occupational History  ? Not on file  ?Tobacco Use  ? Smoking status: Former  ?  Years: 5.00  ?  Types: Cigarettes  ? Smokeless tobacco: Never  ? Tobacco comments:  ?  Quit 3 years ago  ?Vaping Use  ? Vaping Use: Never used  ?Substance and Sexual Activity  ? Alcohol use: Never  ? Drug use: Never  ? Sexual activity: Not on file  ?Other Topics Concern  ? Not on file  ?Social History Narrative  ? Lives here with his wife and 4 children  ? ?Social Determinants of Health  ? ?Financial Resource Strain: Not on file  ?Food Insecurity: Not on file  ?Transportation Needs: Not on file  ?Physical Activity: Not on file  ?Stress: Not on file  ?Social Connections: Not on file  ?Intimate Partner Violence: Not on file  ?  ? ?Review of Systems  ?  ?General:  No chills, fever, night sweats or weight changes.  ?Cardiovascular:  No chest pain, dyspnea on exertion, edema, orthopnea, palpitations, paroxysmal nocturnal dyspnea. ?Dermatological: No rash, lesions/masses ?Respiratory: No cough, dyspnea ?Urologic: No hematuria, dysuria ?Abdominal:   No nausea, vomiting, diarrhea, bright red blood per rectum, melena, or hematemesis ?Neurologic:  No visual changes, wkns, changes in mental status. ?All other systems reviewed and are otherwise negative except as noted above. ? ?  ? ?Physical Exam  ?  ?VS:  There were no vitals taken for this visit. , BMI There is no height or weight on file to calculate BMI. ?    ?GEN: Well nourished, well developed, in no  acute distress. ?HEENT: normal. ?Neck: Supple, no JVD, carotid bruits, or masses. ?Cardiac: RRR, no murmurs, rubs, or gallops. No clubbing, cyanosis, edema.  Radials/DP/PT 2+ and equal bilaterally.  ?Respiratory:  Respirations regular and unlabored, clear to auscultation bilaterally. ?GI: Soft, nontender, nondistended, BS + x 4. ?MS: no deformity or atrophy. ?Skin: warm and dry, no rash. ?Neuro:  Strength and sensation are intact. ?Psych: Normal affect. ? ?Accessory Clinical Findings  ?  ?ECG personally reviewed by me today- *** - No acute changes ? ?Lab Results  ?Component Value Date  ? WBC 4.8 02/21/2021  ? HGB 13.8 02/21/2021  ? HCT 41.0 02/21/2021  ? MCV 88.2 02/21/2021  ? PLT 172 02/21/2021  ? ?Lab Results  ?Component Value Date  ? CREATININE 0.79 02/21/2021  ? BUN 11 02/21/2021  ? NA 139 02/21/2021  ? K 3.7 02/21/2021  ? CL 106 02/21/2021  ? CO2 26 02/21/2021  ? ?Lab Results  ?Component Value Date  ? ALT 39 02/21/2021  ? AST 26 02/21/2021  ? ALKPHOS 79 02/21/2021  ? BILITOT 0.7 02/21/2021  ? ?Lab Results  ?Component Value Date  ? CHOL 227 (H) 01/23/2021  ? HDL 40 01/23/2021  ? LDLCALC 168 (H) 01/23/2021  ? TRIG 107 01/23/2021  ? CHOLHDL 5.7 (H) 01/23/2021  ?  ?No results found for: HGBA1C ? ?Review of Prior Studies: ?MRI:03/08/2021 ?  ?1. Low normal left ventricular systolic function with normal left ?ventricular chamber size. LVEF 51%. Prominent left ventricular ?lateral and apical trabeculations that do not meet criteria for ?noncompaction. ?  ?2. Normal right ventricular systolic function with normal right ?ventricular chamber size. RVEF 56%. ?  ?3. Right sided aortic arch with a right subclavian artery, a common ?origin of the left and right common carotid arteries, and an ?aberrant origin of the left subclavian artery. A Kommerell ?diverticulum is present and the aberrant left subclavian artery ?originates from this location. Immediately distal to this area the ?proximal descending aorta measures 36 mm, the  descending thoracic ?aorta is mildly dilated and measures 33 mm at the mid level. ?  ?4. Venous anatomy appears normal. No left SVC is visualized, no ?anomalous venous drainage is identified. ?  ?5. There is no post contrast delayed myocardial enhancement to ?suggest, scar, fibrosis, inflammation, infarction or infiltration. ?Normal ECV. ? ?Echocardiogram 02/02/2021 ?1. Left ventricular ejection fraction, by estimation, is 45 to 50%. The  ?left ventricle has mildly decreased function. The left ventricle  ?demonstrates global hypokinesis. There is mild left ventricular  ?hypertrophy. Left ventricular diastolic parameters  ?were normal.  ? 2. Right ventricular systolic function is normal. The right ventricular  ?size is mildly enlarged. There is normal pulmonary artery systolic  ?pressure. The estimated right ventricular systolic pressure is [id number] mmHg.  ? 3. The mitral valve is normal in structure. Trivial  mitral valve  ?regurgitation.  ? 4. The aortic valve is tricuspid. Aortic valve regurgitation is mild. No  ?aortic stenosis is present.  ? 5. The inferior vena cava is normal in size with greater than 50%  ?respiratory variability, suggesting right atrial pressure of 3 mmHg.  ? 6. Has reported right sided aortic arch with aberrant left subclavian  ?artery associated with a Kommerell's divericulum, not well visualized on  ?current study, would recommend CTA or MRA for further evaluation  ? ?Assessment & Plan  ? ?1.  *** ? ? ? ?Current medicines are reviewed at length with the patient today.  I have spent *** min's  dedicated to the care of this patient on the date of this encounter to include pre-visit review of records, assessment, management and diagnostic testing,with shared decision making. ?Signed, ?Phill Myron. West Pugh, ANP, AACC ?  ?12/26/2021 5:27 PM    ?[place] ?[address] ?Office [phone] Fax [phone] ? ?Notice: This dictation was prepared with Dragon  dictation along with smaller phrase technology. Any transcriptional errors that result from this process are unintentional and may not be corrected upon review.  ?

## 2021-12-28 ENCOUNTER — Ambulatory Visit: Payer: Medicaid Other | Admitting: Adult Health

## 2022-01-01 ENCOUNTER — Encounter: Payer: Self-pay | Admitting: Adult Health

## 2022-02-15 ENCOUNTER — Ambulatory Visit (HOSPITAL_COMMUNITY): Payer: Medicaid Other | Attending: Cardiology

## 2022-02-15 ENCOUNTER — Encounter (HOSPITAL_COMMUNITY): Payer: Self-pay | Admitting: Cardiology

## 2022-03-26 ENCOUNTER — Other Ambulatory Visit: Payer: Self-pay | Admitting: *Deleted

## 2022-03-26 DIAGNOSIS — I420 Dilated cardiomyopathy: Secondary | ICD-10-CM

## 2022-04-16 ENCOUNTER — Other Ambulatory Visit (HOSPITAL_COMMUNITY): Payer: Medicaid Other

## 2022-04-18 ENCOUNTER — Ambulatory Visit (HOSPITAL_COMMUNITY): Payer: Medicaid Other | Attending: Internal Medicine

## 2022-04-18 DIAGNOSIS — I420 Dilated cardiomyopathy: Secondary | ICD-10-CM | POA: Diagnosis present

## 2022-04-18 LAB — ECHOCARDIOGRAM COMPLETE
Area-P 1/2: 2.87 cm2
P 1/2 time: 930 msec
S' Lateral: 3.5 cm

## 2022-04-24 ENCOUNTER — Encounter: Payer: Self-pay | Admitting: *Deleted

## 2022-08-12 IMAGING — DX DG ABDOMEN ACUTE W/ 1V CHEST
4 series · 4 of 4 positions shown · non-contrast
Comparison: None.

CLINICAL DATA: Abdominal distension

EXAM:
DG ABDOMEN ACUTE WITH 1 VIEW CHEST

[chest pa]
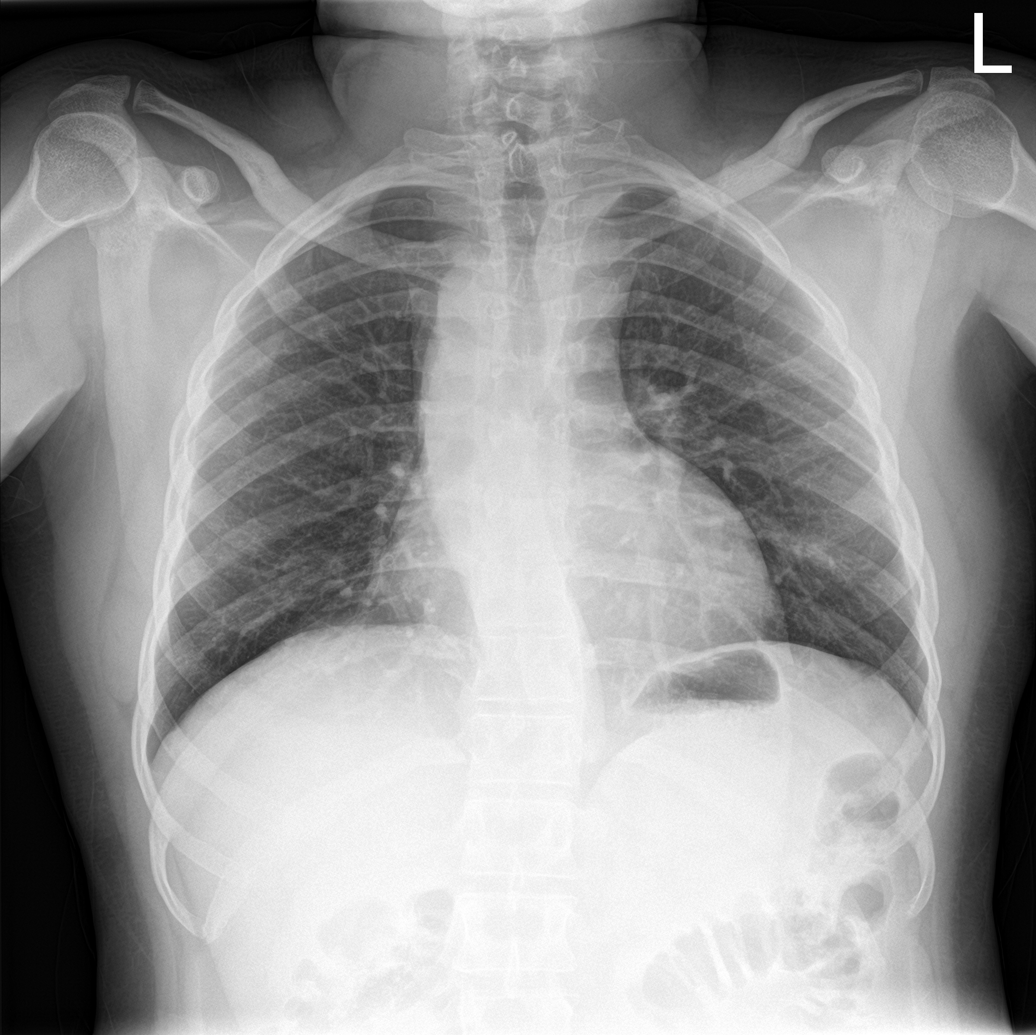

[abdomen erect]
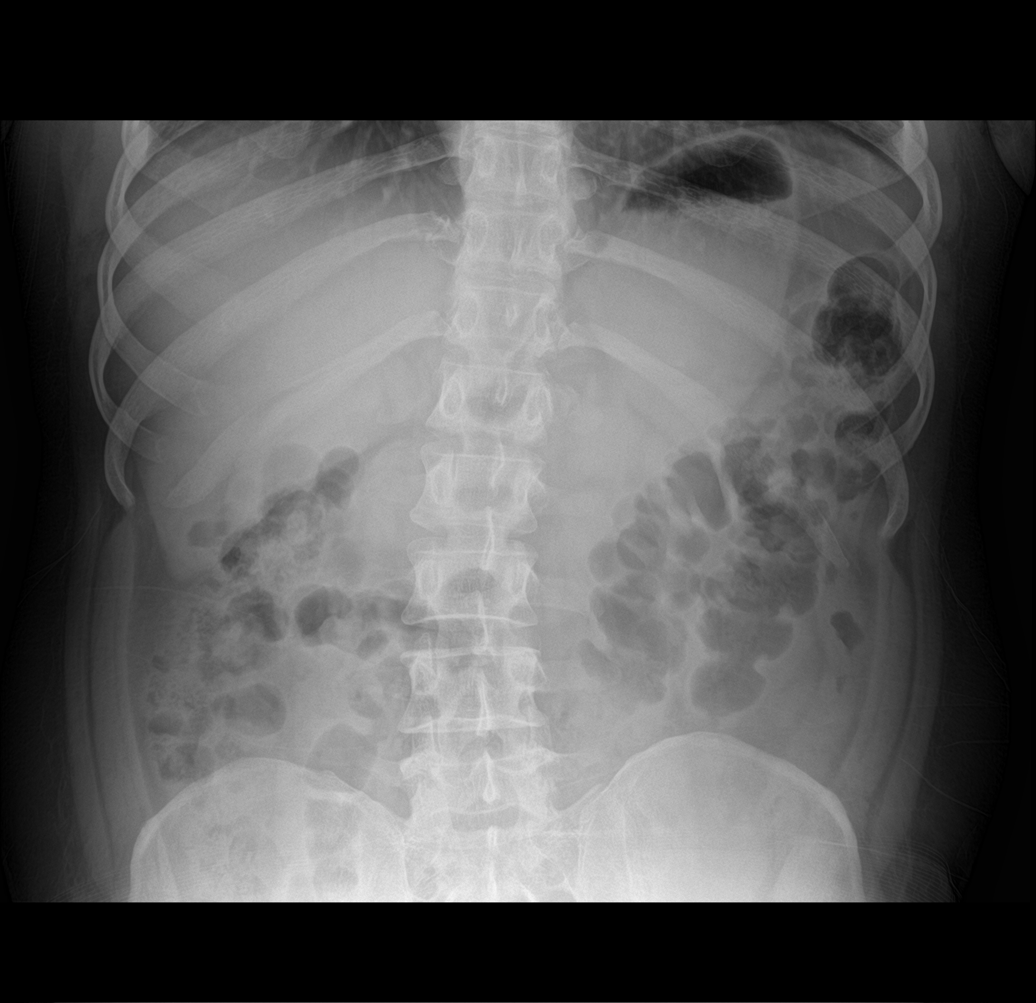

[abdomen supine (1 of 2)]
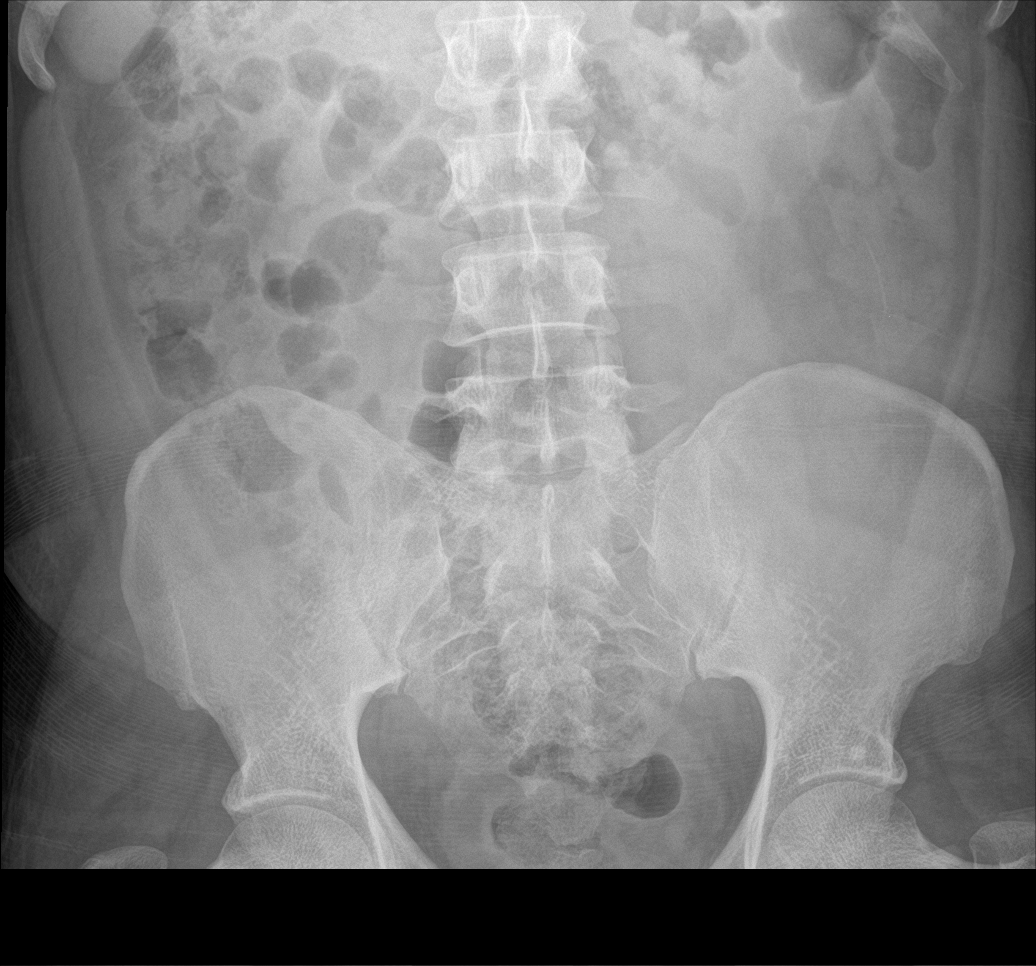

[abdomen supine (2 of 2)]
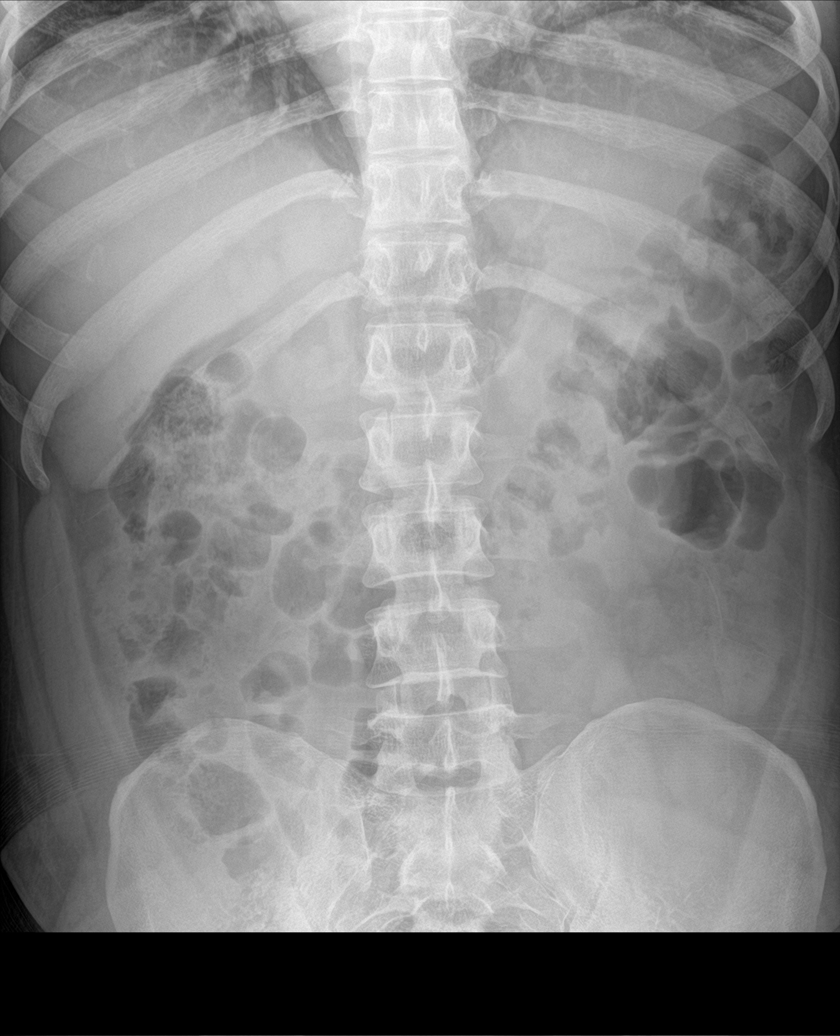

[4 of 4 positions shown; findings below may reference images not displayed]

FINDINGS: Lungs are clear. No pneumothorax or pleural effusion. Mediastinal
contour suggests a possible double aortic arch versus marked
tortuosity of the thoracic aorta. Cardiac size within normal limits.
Pulmonary vascularity is normal.

Normal abdominal gas pattern. No gross free intraperitoneal gas. No
organomegaly. No abnormal calcifications within the abdomen and
pelvis. No acute bone abnormality.
IMPRESSION: Normal abdominal gas pattern.

No radiographic evidence of acute cardiopulmonary disease.

Abnormal mediastinal contour suggesting possible double aortic arch.
This could be better assessed with CT arteriography, if indicated.
